# Patient Record
Sex: Female | Born: 1985 | Hispanic: No | Marital: Single | Smoking: Former smoker
Health system: Southern US, Community
[De-identification: ages and names within clinical notes are randomized; demographics above are authoritative.]

## PROBLEM LIST (undated history)

## (undated) ENCOUNTER — Inpatient Hospital Stay (HOSPITAL_COMMUNITY): Payer: Medicaid Other

## (undated) ENCOUNTER — Inpatient Hospital Stay (HOSPITAL_COMMUNITY): Payer: Self-pay

## (undated) DIAGNOSIS — E119 Type 2 diabetes mellitus without complications: Secondary | ICD-10-CM

## (undated) DIAGNOSIS — Z789 Other specified health status: Secondary | ICD-10-CM

## (undated) DIAGNOSIS — F32A Depression, unspecified: Secondary | ICD-10-CM

## (undated) DIAGNOSIS — I10 Essential (primary) hypertension: Secondary | ICD-10-CM

## (undated) HISTORY — PX: TONSILLECTOMY: SUR1361

## (undated) HISTORY — PX: BREAST SURGERY: SHX581

## (undated) HISTORY — PX: THERAPEUTIC ABORTION: SHX798

## (undated) HISTORY — DX: Depression, unspecified: F32.A

---
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
## 2007-01-08 ENCOUNTER — Inpatient Hospital Stay (HOSPITAL_COMMUNITY): Admission: AD | Admit: 2007-01-08 | Discharge: 2007-01-08 | Payer: Self-pay | Admitting: Obstetrics and Gynecology

## 2007-01-08 ENCOUNTER — Ambulatory Visit: Payer: Self-pay | Admitting: Obstetrics and Gynecology

## 2007-02-23 ENCOUNTER — Emergency Department (HOSPITAL_COMMUNITY): Admission: EM | Admit: 2007-02-23 | Discharge: 2007-02-23 | Payer: Self-pay | Admitting: Emergency Medicine

## 2007-04-18 ENCOUNTER — Ambulatory Visit: Payer: Self-pay | Admitting: Obstetrics and Gynecology

## 2007-04-18 ENCOUNTER — Inpatient Hospital Stay (HOSPITAL_COMMUNITY): Admission: AD | Admit: 2007-04-18 | Discharge: 2007-04-21 | Payer: Self-pay | Admitting: Obstetrics & Gynecology

## 2007-08-16 ENCOUNTER — Ambulatory Visit: Payer: Self-pay | Admitting: Obstetrics & Gynecology

## 2007-08-16 ENCOUNTER — Ambulatory Visit (HOSPITAL_COMMUNITY): Admission: AD | Admit: 2007-08-16 | Discharge: 2007-08-16 | Payer: Self-pay | Admitting: Gynecology

## 2007-08-16 ENCOUNTER — Encounter: Payer: Self-pay | Admitting: Obstetrics & Gynecology

## 2007-11-07 ENCOUNTER — Emergency Department (HOSPITAL_COMMUNITY): Admission: EM | Admit: 2007-11-07 | Discharge: 2007-11-07 | Payer: Self-pay | Admitting: Emergency Medicine

## 2007-12-10 ENCOUNTER — Emergency Department (HOSPITAL_COMMUNITY): Admission: EM | Admit: 2007-12-10 | Discharge: 2007-12-10 | Payer: Self-pay | Admitting: Emergency Medicine

## 2008-01-05 ENCOUNTER — Emergency Department (HOSPITAL_COMMUNITY): Admission: EM | Admit: 2008-01-05 | Discharge: 2008-01-05 | Payer: Self-pay | Admitting: Emergency Medicine

## 2008-04-27 ENCOUNTER — Emergency Department (HOSPITAL_COMMUNITY): Admission: EM | Admit: 2008-04-27 | Discharge: 2008-04-27 | Payer: Self-pay | Admitting: Emergency Medicine

## 2008-07-04 ENCOUNTER — Inpatient Hospital Stay (HOSPITAL_COMMUNITY): Admission: AD | Admit: 2008-07-04 | Discharge: 2008-07-04 | Payer: Self-pay | Admitting: Obstetrics & Gynecology

## 2009-04-30 ENCOUNTER — Emergency Department (HOSPITAL_COMMUNITY): Admission: EM | Admit: 2009-04-30 | Discharge: 2009-04-30 | Payer: Self-pay | Admitting: Emergency Medicine

## 2009-07-06 IMAGING — US US OB COMP LESS 14 WK
1 series · 14 of 25 positions shown · non-contrast
Comparison: none

CLINICAL DATA: 8 weeks pregnant and fainted.
 OBSTETRICAL ULTRASOUND <14 WKS:
TECHNIQUE: Transabdominal ultrasound was performed for evaluation of the gestation as well as the maternal uterus and adnexal regions.

[Series 1: us ob comp less 14 wks · 14 of 25 slices shown]
[im 1/25]
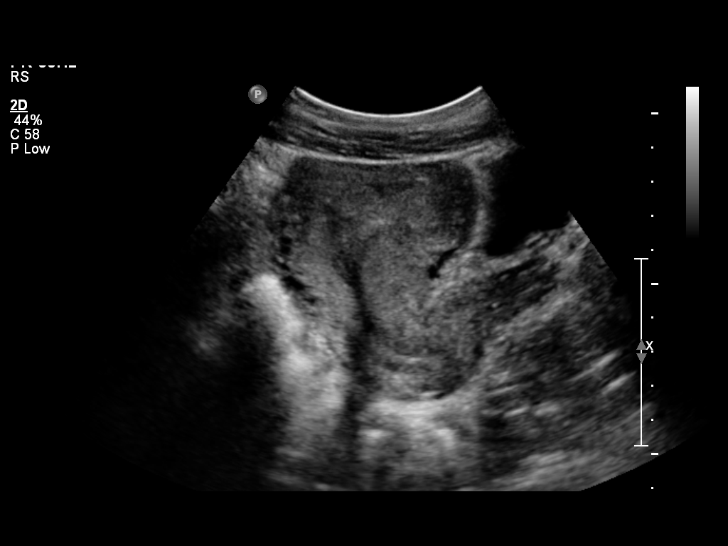
[im 3/25]
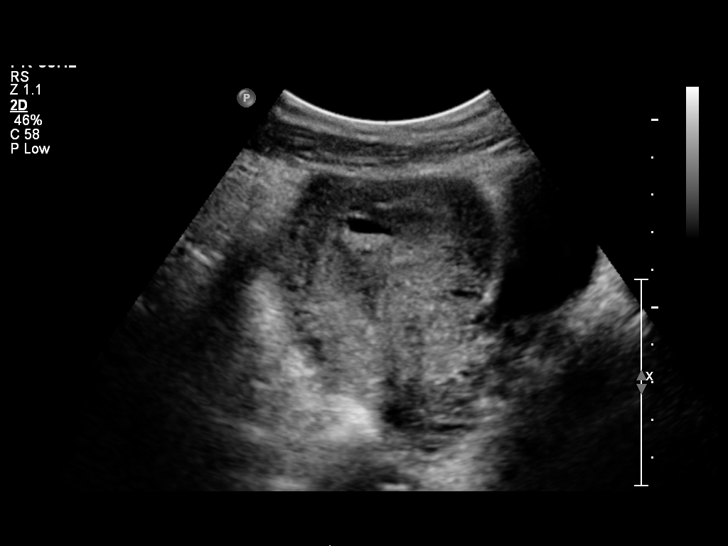
[im 5/25]
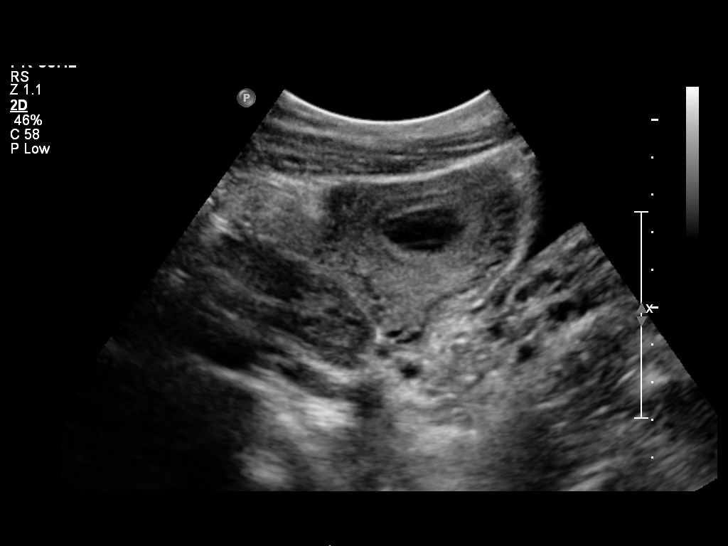
[im 7/25]
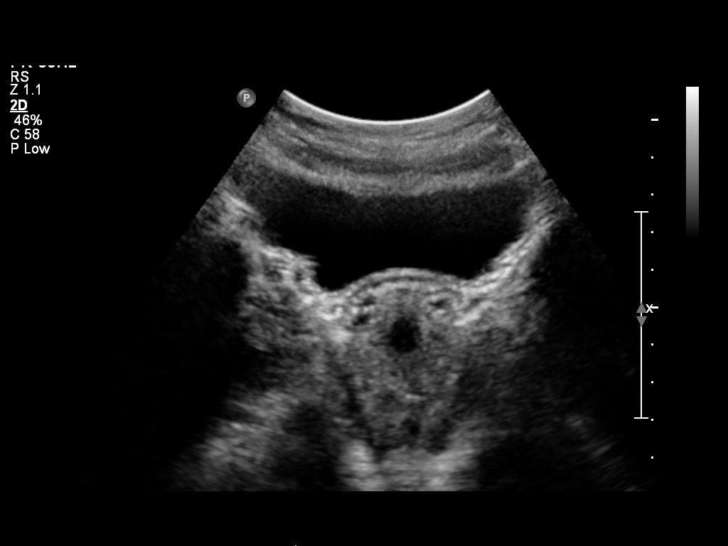
[im 9/25]
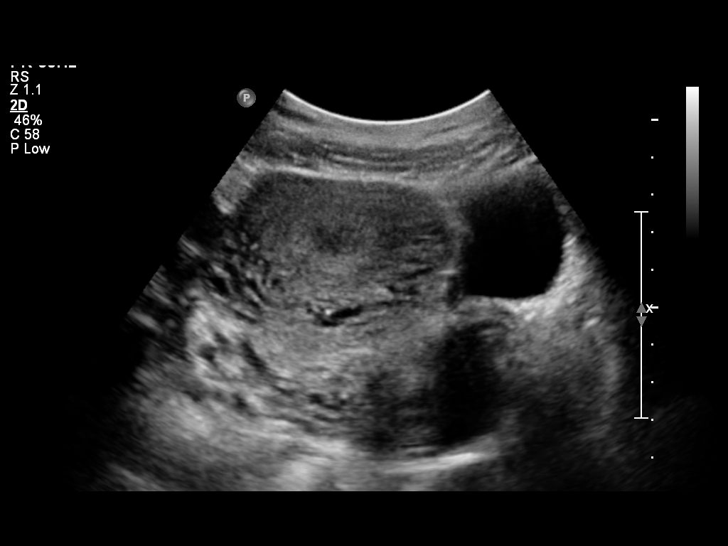
[im 10/25]
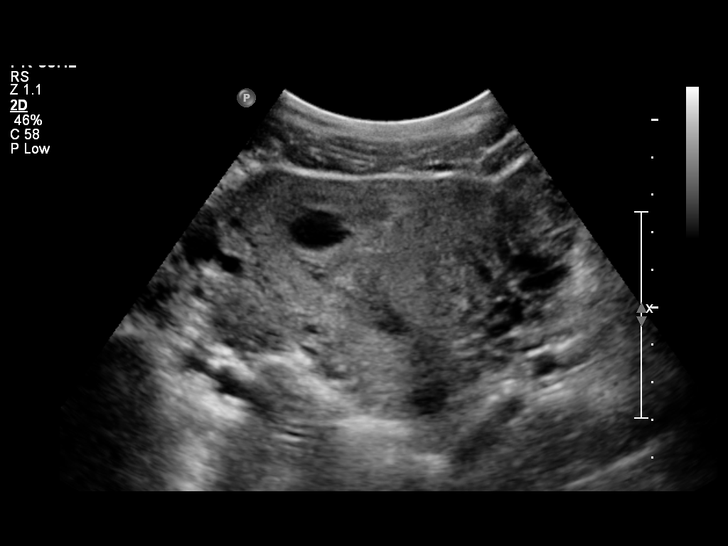
[im 12/25]
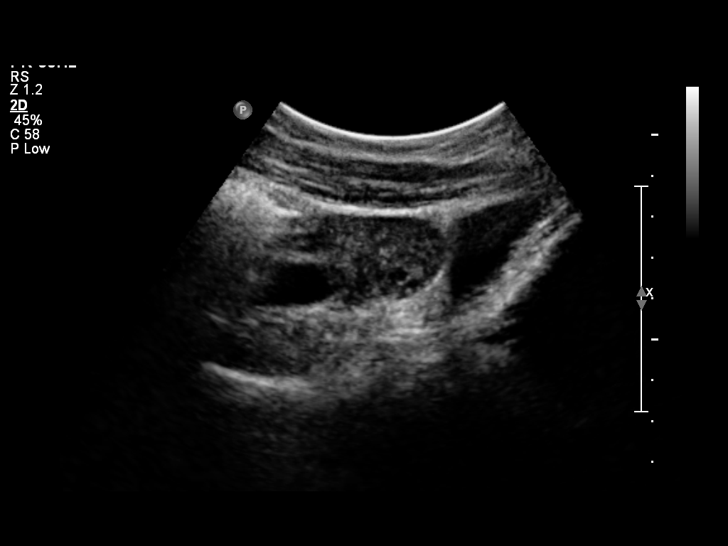
[im 14/25]
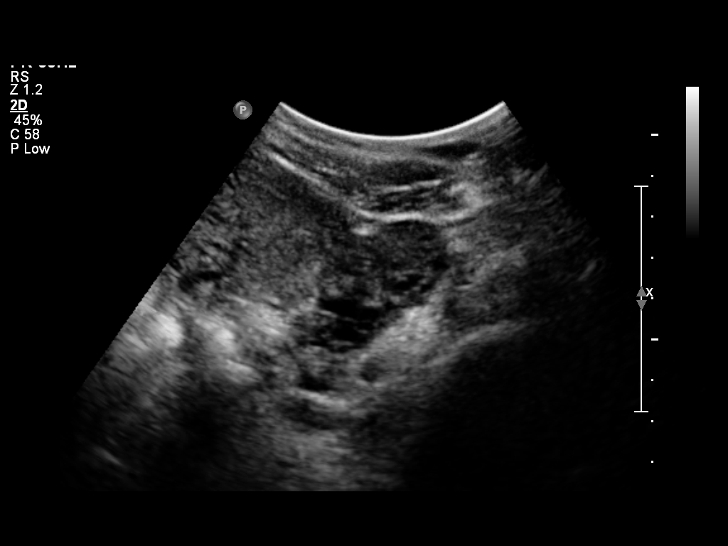
[im 16/25]
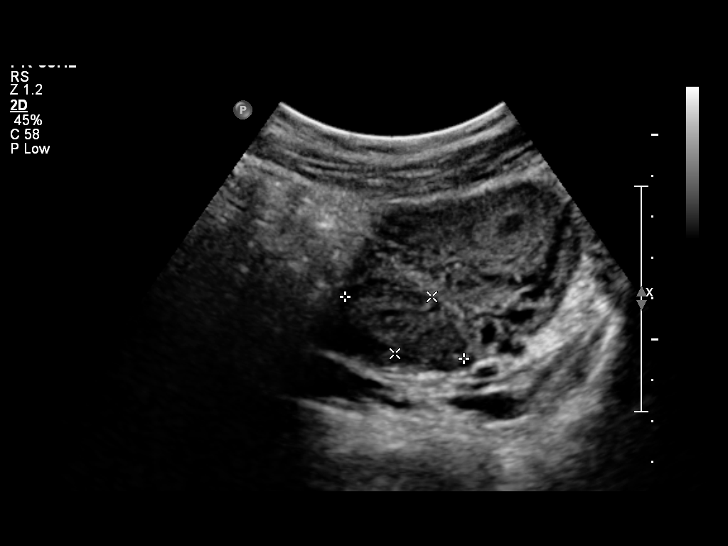
[im 17/25]
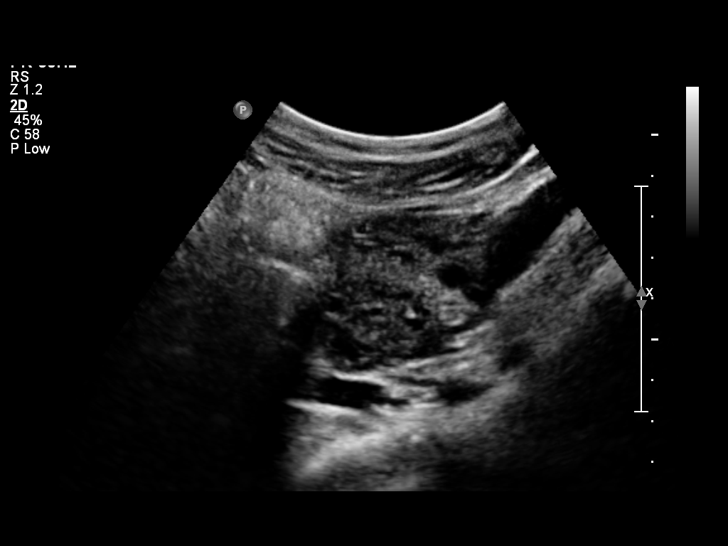
[im 19/25]
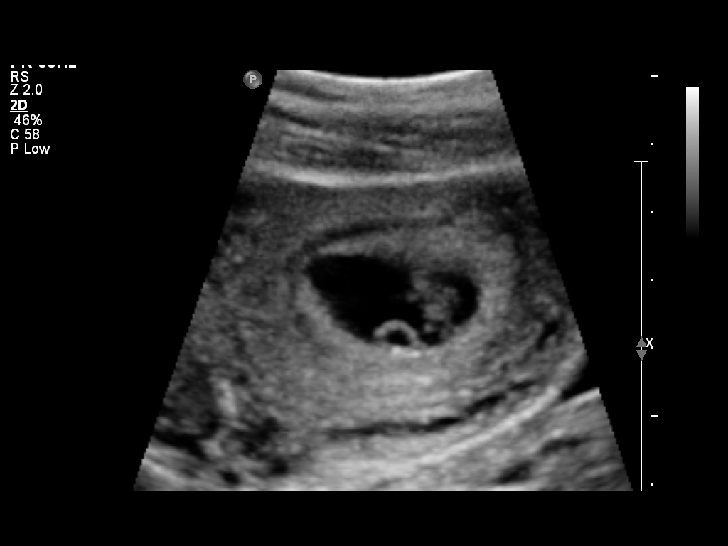
[im 21/25]
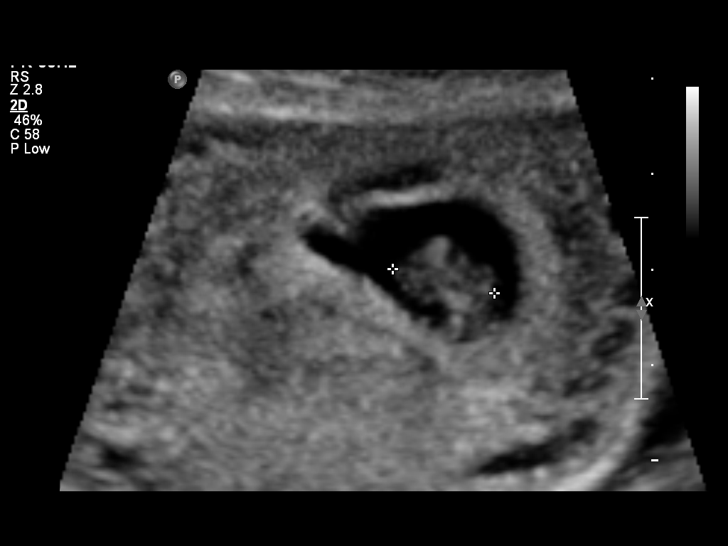
[im 23/25]
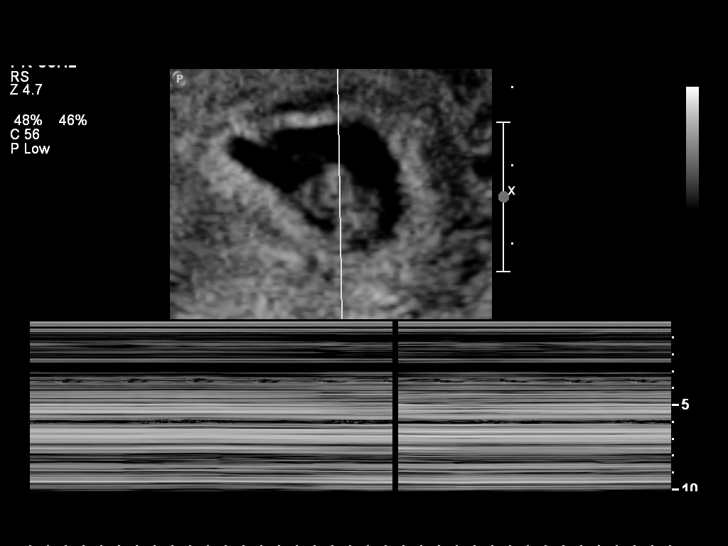
[im 25/25]
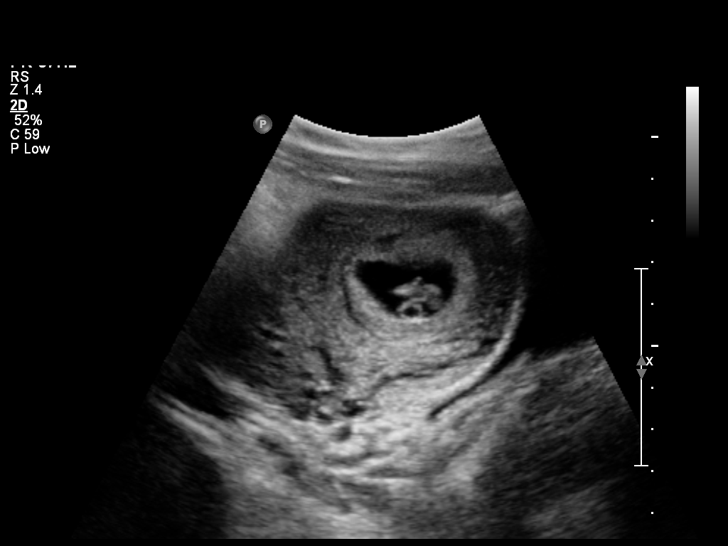

[14 of 25 positions shown; findings below may reference images not displayed]

FINDINGS: There is an intrauterine gestational sac.  Evidence for a fetus measuring 1.12 cm and a calculated age of 7 weeks 2 days.  There is also evidence for a small yolk sac.  Calculated heart rate is 161 bpm.  The ovarian tissue is grossly normal.  The right ovary measures 3.3 x 1.7 x 1.2 cm.  Left ovary measures 1.9 x 3.1 x 1.6 cm.  No significant free fluid.
IMPRESSION: Live single intrauterine fetus.  Calculated gestational age is 7 weeks 2 days and heart rate was 161 bpm.

## 2010-05-25 LAB — WET PREP, GENITAL: Clue Cells Wet Prep HPF POC: NONE SEEN

## 2010-05-25 LAB — URINALYSIS, ROUTINE W REFLEX MICROSCOPIC
Glucose, UA: NEGATIVE mg/dL
Ketones, ur: NEGATIVE mg/dL
Protein, ur: NEGATIVE mg/dL
Specific Gravity, Urine: <1.005 — ABNORMAL LOW (ref 1.005–1.030)

## 2010-05-25 LAB — GC/CHLAMYDIA PROBE AMP, GENITAL: GC Probe Amp, Genital: NEGATIVE

## 2010-06-29 NOTE — Op Note (Signed)
NAMECAROL, Deborah Brock              ACCOUNT NO.:  [id number]   MEDICAL RECORD NO.:  [id number]          PATIENT TYPE:  AMB   LOCATION:  SDC                           FACILITY:  [facility]   PHYSICIAN:  Allie Bossier, MD        DATE OF BIRTH:  [birth year]   DATE OF PROCEDURE:  DATE OF DISCHARGE:                               OPERATIVE REPORT   PREOPERATIVE DIAGNOSIS:  Missed abortion at 17 weeks' estimated  gestational age.   POSTOPERATIVE DIAGNOSIS:  Missed abortion at 8 weeks' estimated  gestational age.   PROCEDURE:  Suction dilation and curettage.   SURGEON:  Myra C. Marice Potter, MD   ANESTHESIA:  MAC, Cristela Blue, MD plus lidocaine 1%, paracervical  block.   COMPLICATIONS:  None.   ESTIMATED BLOOD LOSS:  Minimal.   SPECIMENS:  Uterine curettings.   DETAILS OF PROCEDURE AND FINDINGS:  The risks, benefits, and  alternatives of surgery were explained, she accepted.  She declined  Cytotec.  She was taken to the operating room after consents were  signed.  She was placed in the dorsal lithotomy position.  Her vagina  was prepped and draped in the usual sterile fashion.  Her bladder was  emptied with a Robinson catheter.  Bimanual exam revealed a 6-week size  anteverted mobile uterus and nonenlarged adnexa.  A speculum was placed.  The anterior lip of her paracervix was grasped with a single-tooth  tenaculum.  The cervix was easily and gently dilated with Shawnie Pons dilators  to accommodate a #8 curved suction curette.  Suction was applied.  The  uterus was emptied of its contents.  Sharp curettage in all quadrants  and the fundus of the uterus confirmed complete emptying of the uterus.  The tenaculum was removed.  No bleeding was noted from the site.  Please  note that during the case, a total of 10 mL of 1% lidocaine plain was  used for a paracervical block.  She tolerated the procedure well.  In  the postop, repeat bimanual exam revealed normal size, shape, and  anteverted uterus.  She was  taken to the recovery room in stable  condition.  Instrument, sponge, and needle counts were correct.      Allie Bossier, MD  Electronically Signed     MCD/MEDQ  D:  08/16/2007  T:  08/17/2007  Job:  [id number]

## 2010-07-06 ENCOUNTER — Emergency Department (HOSPITAL_COMMUNITY)
Admission: EM | Admit: 2010-07-06 | Discharge: 2010-07-06 | Disposition: A | Discharge status: Home / Self Care | Payer: Self-pay | Attending: Emergency Medicine | Admitting: Emergency Medicine

## 2010-07-06 DIAGNOSIS — N898 Other specified noninflammatory disorders of vagina: Secondary | ICD-10-CM | POA: Insufficient documentation

## 2010-07-06 LAB — POCT PREGNANCY, URINE: Preg Test, Ur: NEGATIVE

## 2010-07-06 LAB — URINALYSIS, ROUTINE W REFLEX MICROSCOPIC
Hgb urine dipstick: NEGATIVE
Nitrite: NEGATIVE
Specific Gravity, Urine: 1.013 (ref 1.005–1.030)
Urobilinogen, UA: 1 mg/dL (ref 0.0–1.0)

## 2010-07-06 LAB — WET PREP, GENITAL: Yeast Wet Prep HPF POC: NONE SEEN

## 2010-07-07 LAB — GC/CHLAMYDIA PROBE AMP, GENITAL
Chlamydia, DNA Probe: NEGATIVE
GC Probe Amp, Genital: NEGATIVE

## 2010-11-08 LAB — URINALYSIS, ROUTINE W REFLEX MICROSCOPIC
Ketones, ur: NEGATIVE
Leukocytes, UA: NEGATIVE
Protein, ur: NEGATIVE
Urobilinogen, UA: 0.2

## 2010-11-08 LAB — CBC
Hemoglobin: 11.3 — ABNORMAL LOW
Hemoglobin: 9.5 — ABNORMAL LOW
MCV: 81.9
Platelets: 263
RBC: 3.43 — ABNORMAL LOW
RBC: 4.02

## 2010-11-08 LAB — URINE MICROSCOPIC-ADD ON: WBC, UA: NONE SEEN

## 2010-11-08 LAB — RPR: RPR Ser Ql: NONREACTIVE

## 2010-11-11 LAB — CBC
HCT: 35.7 — ABNORMAL LOW
Hemoglobin: 12.1
MCV: 83.1
Platelets: 255
RDW: 13.2

## 2010-11-11 LAB — GC/CHLAMYDIA PROBE AMP, GENITAL: GC Probe Amp, Genital: NEGATIVE

## 2010-11-11 LAB — POCT PREGNANCY, URINE
Operator id: 22333
Preg Test, Ur: POSITIVE

## 2010-11-11 LAB — URINALYSIS, ROUTINE W REFLEX MICROSCOPIC
Glucose, UA: NEGATIVE
Leukocytes, UA: NEGATIVE
Protein, ur: 30 — AB
Specific Gravity, Urine: >1.03 — ABNORMAL HIGH
pH: 6

## 2010-11-11 LAB — WET PREP, GENITAL: Yeast Wet Prep HPF POC: NONE SEEN

## 2010-11-15 LAB — CBC
Hemoglobin: 13.7
MCHC: 33.5
Platelets: 306
RDW: 13.1

## 2010-11-15 LAB — URINALYSIS, ROUTINE W REFLEX MICROSCOPIC
Bilirubin Urine: NEGATIVE
Glucose, UA: NEGATIVE
Nitrite: NEGATIVE
Protein, ur: NEGATIVE
Specific Gravity, Urine: 1.011
Specific Gravity, Urine: 1.02
Urobilinogen, UA: 0.2
Urobilinogen, UA: 1

## 2010-11-15 LAB — POCT PREGNANCY, URINE
Preg Test, Ur: NEGATIVE
Preg Test, Ur: NEGATIVE

## 2010-11-15 LAB — URINE MICROSCOPIC-ADD ON

## 2010-11-15 LAB — WET PREP, GENITAL
Clue Cells Wet Prep HPF POC: NONE SEEN
Trich, Wet Prep: NONE SEEN
WBC, Wet Prep HPF POC: NONE SEEN

## 2010-11-15 LAB — GC/CHLAMYDIA PROBE AMP, GENITAL: GC Probe Amp, Genital: NEGATIVE

## 2010-11-23 LAB — URINALYSIS, ROUTINE W REFLEX MICROSCOPIC
Hgb urine dipstick: NEGATIVE
Nitrite: NEGATIVE
Protein, ur: NEGATIVE
Specific Gravity, Urine: 1.015
Urobilinogen, UA: 0.2

## 2010-11-29 LAB — URINALYSIS, ROUTINE W REFLEX MICROSCOPIC
Bilirubin Urine: NEGATIVE
Glucose, UA: NEGATIVE
Hgb urine dipstick: NEGATIVE
Ketones, ur: 15 — AB
Ketones, ur: NEGATIVE
Protein, ur: NEGATIVE
Specific Gravity, Urine: 1.025
Urobilinogen, UA: 1
pH: 6

## 2010-11-29 LAB — CBC
HCT: 33.2 — ABNORMAL LOW
Hemoglobin: 11.4 — ABNORMAL LOW
MCHC: 34.4
MCV: 83.1
RBC: 3.99

## 2010-11-29 LAB — COMPREHENSIVE METABOLIC PANEL
ALT: 10
BUN: 8
CO2: 25
Calcium: 8.8
Creatinine, Ser: 0.9
GFR calc non Af Amer: >60
Glucose, Bld: 110 — ABNORMAL HIGH

## 2010-11-29 LAB — GC/CHLAMYDIA PROBE AMP, GENITAL: GC Probe Amp, Genital: NEGATIVE

## 2010-11-29 LAB — WET PREP, GENITAL
Clue Cells Wet Prep HPF POC: NONE SEEN
Clue Cells Wet Prep HPF POC: NONE SEEN
Trich, Wet Prep: NONE SEEN
Yeast Wet Prep HPF POC: NONE SEEN
Yeast Wet Prep HPF POC: NONE SEEN

## 2010-11-29 LAB — URINE MICROSCOPIC-ADD ON

## 2010-12-24 ENCOUNTER — Encounter (HOSPITAL_COMMUNITY): Payer: Self-pay

## 2010-12-24 ENCOUNTER — Inpatient Hospital Stay (HOSPITAL_COMMUNITY)
Admission: AD | Admit: 2010-12-24 | Discharge: 2010-12-24 | Disposition: A | Discharge status: Home / Self Care | Payer: Medicaid Other | Source: Ambulatory Visit | Attending: Obstetrics & Gynecology | Admitting: Obstetrics & Gynecology

## 2010-12-24 DIAGNOSIS — R112 Nausea with vomiting, unspecified: Secondary | ICD-10-CM | POA: Insufficient documentation

## 2010-12-24 DIAGNOSIS — Z711 Person with feared health complaint in whom no diagnosis is made: Secondary | ICD-10-CM

## 2010-12-24 DIAGNOSIS — Z3202 Encounter for pregnancy test, result negative: Secondary | ICD-10-CM | POA: Insufficient documentation

## 2010-12-24 HISTORY — DX: Other specified health status: Z78.9

## 2010-12-24 LAB — URINALYSIS, ROUTINE W REFLEX MICROSCOPIC
Glucose, UA: NEGATIVE mg/dL
Hgb urine dipstick: NEGATIVE
Protein, ur: NEGATIVE mg/dL

## 2010-12-24 NOTE — ED Provider Notes (Signed)
History     No chief complaint on file.  HPIKimberly 62 y.o.presents with wanted confirmation of pregnancy.  G5 P2 0 2 2.  Sexually active X 1 using condoms occasionally.  Does not desire pregnancy. She did 2 pregnancy tests at home that were "very light".  She has not missed a period, came 1 month ago and she expects it any day.   Reports nausea and vomiting.  Denies vaginal bleeding or discharge.  Patient of [facility]. Has appt at her doctor for IUD insertion end of the month.       Past Medical History  Diagnosis Date  . No pertinent past medical history     Past Surgical History  Procedure Date  . Tonsillectomy   . Breast surgery     No family history on file.  History  Substance Use Topics  . Smoking status: Not on file  . Smokeless tobacco: Not on file  . Alcohol Use:     Allergies: No Known Allergies  Prescriptions prior to admission  Medication Sig Dispense Refill  . albuterol (PROVENTIL HFA;VENTOLIN HFA) 108 (90 BASE) MCG/ACT inhaler Inhale 2 puffs into the lungs daily as needed. For asthma       . ibuprofen (ADVIL,MOTRIN) 200 MG tablet Take 400 mg by mouth daily as needed. For pain         Review of Systems  Constitutional: Negative.   HENT: Negative.   Respiratory: Negative.   Gastrointestinal: Positive for nausea. Negative for vomiting and abdominal pain.  Genitourinary:       Negative for vaginal bleeding or discharge   Physical Exam   Blood pressure 112/73, pulse 98, temperature 98.8 F (37.1 C), temperature source Oral, resp. rate 18, height 5' 1.5" (1.562 m), weight 117 lb (53.071 kg), last menstrual period 11/15/2010.  Physical Exam   I began to instruct her on her examination and she stated she had a full exam 1 month ago and her cultures and lab work there were negative.  I offered again and she declined.    MAU Course  Procedures  MDM   Assessment and Plan  A:  Feared pregnancy  Negative pregnancy test   P: Use condoms until  your IUD is inserted.  Repeat pregnancy test if she missed her period.   KEY,EVE M 12/24/2010, 3:02 PM   Matt Holmes, NP 12/24/10 1516

## 2010-12-24 NOTE — ED Provider Notes (Signed)
Attestation of Attending Supervision of Advanced Practitioner: Evaluation and management procedures were performed by the PA/NP/CNM/OB Fellow under my supervision/collaboration. Chart reviewed, and agree with management and plan.  Rasul Decola, M.D. 12/24/2010 3:46 PM   

## 2010-12-24 NOTE — Progress Notes (Signed)
Pt states wanted to know if she was pregnant b/c she had 2 positive home pregnancy tests. Has experience intermittent nausea but denise vaginal bleeding/discharge/abdominal pain. States if she's not pregnant wants birth control. LMP 11/15/10.

## 2013-12-16 ENCOUNTER — Encounter (HOSPITAL_COMMUNITY): Payer: Self-pay

## 2015-02-15 NOTE — L&D Delivery Note (Signed)
Delivery Note Pt reached complete dilation and pushed great.  At 11:51 AM a healthy female was delivered via NSVD (Presentation: OA ).  APGAR: 8, 9; weight  pending.   Placenta status: delivered spontaneously .  Cord:  with the following complications: none .    Anesthesia: epidural Episiotomy: none   Lacerations: right abrasion   Suture Repair: n/a Est. Blood Loss (mL):  125ml  Mom to postpartum.  Baby to Couplet care / Skin to Skin. D/Brock parents circumcision and they plan to perform in office. Deborah Brock,Deborah Brock 10/25/2015, 12:10 PM

## 2015-03-10 ENCOUNTER — Encounter: Payer: Self-pay | Admitting: *Deleted

## 2015-03-10 DIAGNOSIS — Z3490 Encounter for supervision of normal pregnancy, unspecified, unspecified trimester: Secondary | ICD-10-CM | POA: Insufficient documentation

## 2015-03-10 DIAGNOSIS — E559 Vitamin D deficiency, unspecified: Secondary | ICD-10-CM | POA: Insufficient documentation

## 2015-03-25 ENCOUNTER — Encounter: Payer: Self-pay | Admitting: Student

## 2015-03-25 ENCOUNTER — Encounter: Payer: Medicaid Other | Admitting: Student

## 2015-07-21 ENCOUNTER — Inpatient Hospital Stay (HOSPITAL_COMMUNITY)
Admission: AD | Admit: 2015-07-21 | Payer: Medicaid Other | Source: Ambulatory Visit | Admitting: Obstetrics and Gynecology

## 2015-08-07 DIAGNOSIS — O98519 Other viral diseases complicating pregnancy, unspecified trimester: Secondary | ICD-10-CM | POA: Insufficient documentation

## 2015-08-07 DIAGNOSIS — B009 Herpesviral infection, unspecified: Secondary | ICD-10-CM | POA: Insufficient documentation

## 2015-09-23 ENCOUNTER — Inpatient Hospital Stay (HOSPITAL_COMMUNITY)
Admission: AD | Admit: 2015-09-23 | Discharge: 2015-09-23 | Disposition: A | Discharge status: Home / Self Care | Payer: Medicaid Other | Source: Ambulatory Visit | Attending: Obstetrics and Gynecology | Admitting: Obstetrics and Gynecology

## 2015-09-23 DIAGNOSIS — Z3A00 Weeks of gestation of pregnancy not specified: Secondary | ICD-10-CM | POA: Diagnosis not present

## 2015-09-23 MED ORDER — BETAMETHASONE SOD PHOS & ACET 6 (3-3) MG/ML IJ SUSP
12.0000 mg | Freq: Once | INTRAMUSCULAR | Status: AC
  Administered 2015-09-23: 12 mg via INTRAMUSCULAR
  Filled 2015-09-23: qty 2

## 2015-09-23 NOTE — MAU Note (Signed)
Patient here to receive first injection of betamethasone

## 2015-09-24 ENCOUNTER — Inpatient Hospital Stay (HOSPITAL_COMMUNITY)
Admission: AD | Admit: 2015-09-24 | Discharge: 2015-09-24 | Disposition: A | Discharge status: Home / Self Care | Payer: Medicaid Other | Source: Ambulatory Visit | Attending: Obstetrics & Gynecology | Admitting: Obstetrics & Gynecology

## 2015-09-24 DIAGNOSIS — O26899 Other specified pregnancy related conditions, unspecified trimester: Secondary | ICD-10-CM | POA: Insufficient documentation

## 2015-09-24 MED ORDER — BETAMETHASONE SOD PHOS & ACET 6 (3-3) MG/ML IJ SUSP
12.0000 mg | Freq: Once | INTRAMUSCULAR | Status: AC
  Administered 2015-09-24: 12 mg via INTRAMUSCULAR
  Filled 2015-09-24: qty 2

## 2015-10-22 NOTE — MAU Provider Note (Signed)
Pt seen in the office with preterm cervical dilation that had progressed since last visit.  No contractions or leakage of fluid and good FM.  Pt sent to hospital to get betamethasone for preterm delivery risk.

## 2015-10-24 ENCOUNTER — Encounter (HOSPITAL_COMMUNITY): Payer: Self-pay | Admitting: *Deleted

## 2015-10-24 ENCOUNTER — Inpatient Hospital Stay (HOSPITAL_COMMUNITY)
Admission: AD | Admit: 2015-10-24 | Discharge: 2015-10-27 | DRG: 774 | Disposition: A | Discharge status: Home / Self Care | Payer: Medicaid Other | Source: Ambulatory Visit | Attending: Obstetrics and Gynecology | Admitting: Obstetrics and Gynecology

## 2015-10-24 DIAGNOSIS — Z3A37 37 weeks gestation of pregnancy: Secondary | ICD-10-CM

## 2015-10-24 DIAGNOSIS — Z8249 Family history of ischemic heart disease and other diseases of the circulatory system: Secondary | ICD-10-CM

## 2015-10-24 DIAGNOSIS — O4292 Full-term premature rupture of membranes, unspecified as to length of time between rupture and onset of labor: Principal | ICD-10-CM | POA: Diagnosis present

## 2015-10-24 DIAGNOSIS — A6000 Herpesviral infection of urogenital system, unspecified: Secondary | ICD-10-CM | POA: Diagnosis present

## 2015-10-24 DIAGNOSIS — O9962 Diseases of the digestive system complicating childbirth: Secondary | ICD-10-CM | POA: Diagnosis present

## 2015-10-24 DIAGNOSIS — O9832 Other infections with a predominantly sexual mode of transmission complicating childbirth: Secondary | ICD-10-CM | POA: Diagnosis present

## 2015-10-24 DIAGNOSIS — O99334 Smoking (tobacco) complicating childbirth: Secondary | ICD-10-CM | POA: Diagnosis present

## 2015-10-24 DIAGNOSIS — Z833 Family history of diabetes mellitus: Secondary | ICD-10-CM

## 2015-10-24 DIAGNOSIS — K219 Gastro-esophageal reflux disease without esophagitis: Secondary | ICD-10-CM | POA: Diagnosis present

## 2015-10-24 LAB — AMNISURE RUPTURE OF MEMBRANE (ROM) NOT AT ARMC: Amnisure ROM: POSITIVE

## 2015-10-24 LAB — POCT FERN TEST: POCT FERN TEST: NEGATIVE

## 2015-10-24 NOTE — MAU Note (Signed)
Pt reports ? Leaking fluid since 0700, reports a few contractions. Last SVE 4/80.

## 2015-10-25 ENCOUNTER — Inpatient Hospital Stay (HOSPITAL_COMMUNITY): Payer: Medicaid Other | Admitting: Anesthesiology

## 2015-10-25 ENCOUNTER — Encounter (HOSPITAL_COMMUNITY): Payer: Self-pay | Admitting: *Deleted

## 2015-10-25 DIAGNOSIS — Z833 Family history of diabetes mellitus: Secondary | ICD-10-CM | POA: Diagnosis not present

## 2015-10-25 DIAGNOSIS — O99334 Smoking (tobacco) complicating childbirth: Secondary | ICD-10-CM | POA: Diagnosis present

## 2015-10-25 DIAGNOSIS — Z3A37 37 weeks gestation of pregnancy: Secondary | ICD-10-CM | POA: Diagnosis not present

## 2015-10-25 DIAGNOSIS — Z3483 Encounter for supervision of other normal pregnancy, third trimester: Secondary | ICD-10-CM | POA: Diagnosis present

## 2015-10-25 DIAGNOSIS — K219 Gastro-esophageal reflux disease without esophagitis: Secondary | ICD-10-CM | POA: Diagnosis present

## 2015-10-25 DIAGNOSIS — Z8249 Family history of ischemic heart disease and other diseases of the circulatory system: Secondary | ICD-10-CM | POA: Diagnosis not present

## 2015-10-25 DIAGNOSIS — A6000 Herpesviral infection of urogenital system, unspecified: Secondary | ICD-10-CM | POA: Diagnosis present

## 2015-10-25 DIAGNOSIS — O4292 Full-term premature rupture of membranes, unspecified as to length of time between rupture and onset of labor: Secondary | ICD-10-CM | POA: Diagnosis present

## 2015-10-25 DIAGNOSIS — O9832 Other infections with a predominantly sexual mode of transmission complicating childbirth: Secondary | ICD-10-CM | POA: Diagnosis present

## 2015-10-25 DIAGNOSIS — O9962 Diseases of the digestive system complicating childbirth: Secondary | ICD-10-CM | POA: Diagnosis present

## 2015-10-25 LAB — CBC
HCT: 31.6 % — ABNORMAL LOW (ref 36.0–46.0)
HEMOGLOBIN: 10.7 g/dL — AB (ref 12.0–15.0)
MCH: 28.2 pg (ref 26.0–34.0)
MCHC: 33.9 g/dL (ref 30.0–36.0)
MCV: 83.2 fL (ref 78.0–100.0)
PLATELETS: 247 10*3/uL (ref 150–400)
RBC: 3.8 MIL/uL — AB (ref 3.87–5.11)
RDW: 14.6 % (ref 11.5–15.5)
WBC: 16.9 10*3/uL — AB (ref 4.0–10.5)

## 2015-10-25 LAB — RPR: RPR: NONREACTIVE

## 2015-10-25 LAB — TYPE AND SCREEN
ABO/RH(D): O POS
ANTIBODY SCREEN: NEGATIVE

## 2015-10-25 MED ORDER — PHENYLEPHRINE 40 MCG/ML (10ML) SYRINGE FOR IV PUSH (FOR BLOOD PRESSURE SUPPORT)
PREFILLED_SYRINGE | INTRAVENOUS | Status: AC
  Filled 2015-10-25: qty 20

## 2015-10-25 MED ORDER — OXYCODONE HCL 5 MG PO TABS
5.0000 mg | ORAL_TABLET | ORAL | Status: DC | PRN
  Administered 2015-10-25: 5 mg via ORAL
  Filled 2015-10-25: qty 1

## 2015-10-25 MED ORDER — LIDOCAINE HCL (PF) 1 % IJ SOLN
30.0000 mL | INTRAMUSCULAR | Status: DC | PRN
  Filled 2015-10-25: qty 30

## 2015-10-25 MED ORDER — FENTANYL 2.5 MCG/ML BUPIVACAINE 1/10 % EPIDURAL INFUSION (WH - ANES)
INTRAMUSCULAR | Status: AC
  Filled 2015-10-25: qty 125

## 2015-10-25 MED ORDER — EPHEDRINE 5 MG/ML INJ
10.0000 mg | INTRAVENOUS | Status: DC | PRN
  Filled 2015-10-25: qty 4

## 2015-10-25 MED ORDER — PRENATAL MULTIVITAMIN CH
1.0000 | ORAL_TABLET | Freq: Every day | ORAL | Status: DC
  Administered 2015-10-26 – 2015-10-27 (×2): 1 via ORAL
  Filled 2015-10-25 (×2): qty 1

## 2015-10-25 MED ORDER — OXYTOCIN 40 UNITS IN LACTATED RINGERS INFUSION - SIMPLE MED
1.0000 m[IU]/min | INTRAVENOUS | Status: DC
  Administered 2015-10-25: 2 m[IU]/min via INTRAVENOUS
  Filled 2015-10-25: qty 1000

## 2015-10-25 MED ORDER — ONDANSETRON HCL 4 MG PO TABS: 4.0000 mg | ORAL_TABLET | ORAL | Status: DC | PRN

## 2015-10-25 MED ORDER — DIPHENHYDRAMINE HCL 25 MG PO CAPS: 25.0000 mg | ORAL_CAPSULE | Freq: Four times a day (QID) | ORAL | Status: DC | PRN

## 2015-10-25 MED ORDER — PENICILLIN G POTASSIUM 5000000 UNITS IJ SOLR
5.0000 10*6.[IU] | Freq: Once | INTRAVENOUS | Status: AC
  Administered 2015-10-25: 5 10*6.[IU] via INTRAVENOUS
  Filled 2015-10-25: qty 5

## 2015-10-25 MED ORDER — TETANUS-DIPHTH-ACELL PERTUSSIS 5-2.5-18.5 LF-MCG/0.5 IM SUSP: 0.5000 mL | Freq: Once | INTRAMUSCULAR | Status: DC

## 2015-10-25 MED ORDER — LACTATED RINGERS IV SOLN
500.0000 mL | Freq: Once | INTRAVENOUS | Status: AC
  Administered 2015-10-25: 500 mL via INTRAVENOUS

## 2015-10-25 MED ORDER — LACTATED RINGERS IV SOLN
INTRAVENOUS | Status: DC
  Administered 2015-10-25 (×2): via INTRAVENOUS

## 2015-10-25 MED ORDER — ACETAMINOPHEN 325 MG PO TABS: 650.0000 mg | ORAL_TABLET | ORAL | Status: DC | PRN

## 2015-10-25 MED ORDER — OXYTOCIN 40 UNITS IN LACTATED RINGERS INFUSION - SIMPLE MED: 2.5000 [IU]/h | INTRAVENOUS | Status: DC

## 2015-10-25 MED ORDER — ONDANSETRON HCL 4 MG/2ML IJ SOLN: 4.0000 mg | INTRAMUSCULAR | Status: DC | PRN

## 2015-10-25 MED ORDER — ZOLPIDEM TARTRATE 5 MG PO TABS: 5.0000 mg | ORAL_TABLET | Freq: Every evening | ORAL | Status: DC | PRN

## 2015-10-25 MED ORDER — ACETAMINOPHEN 325 MG PO TABS
650.0000 mg | ORAL_TABLET | ORAL | Status: DC | PRN
  Administered 2015-10-25 – 2015-10-27 (×4): 650 mg via ORAL
  Filled 2015-10-25 (×4): qty 2

## 2015-10-25 MED ORDER — DIPHENHYDRAMINE HCL 50 MG/ML IJ SOLN: 12.5000 mg | INTRAMUSCULAR | Status: DC | PRN

## 2015-10-25 MED ORDER — TERBUTALINE SULFATE 1 MG/ML IJ SOLN
0.2500 mg | Freq: Once | INTRAMUSCULAR | Status: DC | PRN
  Filled 2015-10-25: qty 1

## 2015-10-25 MED ORDER — SIMETHICONE 80 MG PO CHEW: 80.0000 mg | CHEWABLE_TABLET | ORAL | Status: DC | PRN

## 2015-10-25 MED ORDER — PHENYLEPHRINE 40 MCG/ML (10ML) SYRINGE FOR IV PUSH (FOR BLOOD PRESSURE SUPPORT)
80.0000 ug | PREFILLED_SYRINGE | INTRAVENOUS | Status: DC | PRN
  Filled 2015-10-25: qty 5

## 2015-10-25 MED ORDER — BENZOCAINE-MENTHOL 20-0.5 % EX AERO
1.0000 "application " | INHALATION_SPRAY | CUTANEOUS | Status: DC | PRN
  Administered 2015-10-25: 1 via TOPICAL
  Filled 2015-10-25: qty 56

## 2015-10-25 MED ORDER — FLEET ENEMA 7-19 GM/118ML RE ENEM: 1.0000 | ENEMA | RECTAL | Status: DC | PRN

## 2015-10-25 MED ORDER — OXYCODONE-ACETAMINOPHEN 5-325 MG PO TABS: 1.0000 | ORAL_TABLET | ORAL | Status: DC | PRN

## 2015-10-25 MED ORDER — DIBUCAINE 1 % RE OINT: 1.0000 "application " | TOPICAL_OINTMENT | RECTAL | Status: DC | PRN

## 2015-10-25 MED ORDER — LIDOCAINE HCL (PF) 1 % IJ SOLN
INTRAMUSCULAR | Status: DC | PRN
  Administered 2015-10-25 (×2): 4 mL via EPIDURAL

## 2015-10-25 MED ORDER — WITCH HAZEL-GLYCERIN EX PADS: 1.0000 "application " | MEDICATED_PAD | CUTANEOUS | Status: DC | PRN

## 2015-10-25 MED ORDER — ONDANSETRON HCL 4 MG/2ML IJ SOLN
4.0000 mg | Freq: Four times a day (QID) | INTRAMUSCULAR | Status: DC | PRN
Start: 2015-10-25 — End: 2015-10-25
  Administered 2015-10-25 (×2): 4 mg via INTRAVENOUS
  Filled 2015-10-25 (×2): qty 2

## 2015-10-25 MED ORDER — LACTATED RINGERS IV SOLN: 500.0000 mL | INTRAVENOUS | Status: DC | PRN

## 2015-10-25 MED ORDER — ALBUTEROL SULFATE (2.5 MG/3ML) 0.083% IN NEBU: 2.5000 mg | INHALATION_SOLUTION | Freq: Four times a day (QID) | RESPIRATORY_TRACT | Status: DC | PRN

## 2015-10-25 MED ORDER — OXYTOCIN BOLUS FROM INFUSION
500.0000 mL | Freq: Once | INTRAVENOUS | Status: AC
  Administered 2015-10-25: 500 mL/h via INTRAVENOUS

## 2015-10-25 MED ORDER — SOD CITRATE-CITRIC ACID 500-334 MG/5ML PO SOLN
30.0000 mL | ORAL | Status: DC | PRN
  Administered 2015-10-25: 30 mL via ORAL
  Filled 2015-10-25: qty 15

## 2015-10-25 MED ORDER — FENTANYL CITRATE (PF) 100 MCG/2ML IJ SOLN
50.0000 ug | INTRAMUSCULAR | Status: DC | PRN
  Administered 2015-10-25: 50 ug via INTRAVENOUS
  Filled 2015-10-25: qty 2

## 2015-10-25 MED ORDER — SENNOSIDES-DOCUSATE SODIUM 8.6-50 MG PO TABS
2.0000 | ORAL_TABLET | ORAL | Status: DC
  Administered 2015-10-26 (×2): 2 via ORAL
  Filled 2015-10-25 (×2): qty 2

## 2015-10-25 MED ORDER — COCONUT OIL OIL: 1.0000 "application " | TOPICAL_OIL | Status: DC | PRN

## 2015-10-25 MED ORDER — PENICILLIN G POTASSIUM 5000000 UNITS IJ SOLR
2.5000 10*6.[IU] | INTRAMUSCULAR | Status: DC
  Administered 2015-10-25: 2.5 10*6.[IU] via INTRAVENOUS
  Filled 2015-10-25 (×6): qty 2.5

## 2015-10-25 MED ORDER — OXYCODONE-ACETAMINOPHEN 5-325 MG PO TABS: 2.0000 | ORAL_TABLET | ORAL | Status: DC | PRN

## 2015-10-25 MED ORDER — FENTANYL 2.5 MCG/ML BUPIVACAINE 1/10 % EPIDURAL INFUSION (WH - ANES)
14.0000 mL/h | INTRAMUSCULAR | Status: DC | PRN
  Administered 2015-10-25: 11.5 mL/h via EPIDURAL
  Administered 2015-10-25: 14 mL/h via EPIDURAL
  Filled 2015-10-25: qty 125

## 2015-10-25 MED ORDER — IBUPROFEN 600 MG PO TABS
600.0000 mg | ORAL_TABLET | Freq: Four times a day (QID) | ORAL | Status: DC
  Administered 2015-10-25 – 2015-10-27 (×8): 600 mg via ORAL
  Filled 2015-10-25 (×8): qty 1

## 2015-10-25 NOTE — Anesthesia Procedure Notes (Signed)
Epidural Patient location during procedure: OB Start time: 10/25/2015 2:47 AM  Staffing Anesthesiologist: Mal AmabileFOSTER, Makyla Bye Performed: anesthesiologist   Preanesthetic Checklist Completed: patient identified, site marked, surgical consent, pre-op evaluation, timeout performed, IV checked, risks and benefits discussed and monitors and equipment checked  Epidural Patient position: sitting Prep: site prepped and draped and DuraPrep Patient monitoring: continuous pulse ox and blood pressure Approach: midline Location: L3-L4 Injection technique: LOR air  Needle:  Needle type: Tuohy  Needle gauge: 17 G Needle length: 9 cm and 9 Needle insertion depth: 4 cm Catheter type: closed end flexible Catheter size: 19 Gauge Catheter at skin depth: 9 cm Test dose: negative and Other  Assessment Events: blood not aspirated, injection not painful, no injection resistance, negative IV test and no paresthesia  Additional Notes Patient identified. Risks and benefits discussed including failed block, incomplete  Pain control, post dural puncture headache, nerve damage, paralysis, blood pressure Changes, nausea, vomiting, reactions to medications-both toxic and allergic and post Partum back pain. All questions were answered. Patient expressed understanding and wished to proceed. Sterile technique was used throughout procedure. Epidural site was Dressed with sterile barrier dressing. No paresthesias, signs of intravascular injection Or signs of intrathecal spread were encountered.  Patient was more comfortable after the epidural was dosed. Please see RN's note for documentation of vital signs and FHR which are stable.

## 2015-10-25 NOTE — Anesthesia Postprocedure Evaluation (Signed)
Anesthesia Post Note  Patient: Deborah Brock  Procedure(s) Performed: * No procedures listed *  Patient location during evaluation: Mother Baby Anesthesia Type: Epidural Level of consciousness: awake and alert, oriented and patient cooperative Pain management: pain level controlled Vital Signs Assessment: post-procedure vital signs reviewed and stable Respiratory status: spontaneous breathing Cardiovascular status: stable Postop Assessment: no headache, epidural receding, patient able to bend at knees and no signs of nausea or vomiting Anesthetic complications: no Comments: Pain score 5.  Received pain medication immediately prior to interview.     Last Vitals:  Vitals:   10/25/15 1345 10/25/15 1505  BP: 114/68 113/62  Pulse: (!) 57 (!) 59  Resp: 18 20  Temp: 36.8 C 36.7 C    Last Pain:  Vitals:   10/25/15 1741  TempSrc:   PainSc: 6    Pain Goal: Patients Stated Pain Goal: 2 (10/25/15 0200)               Merrilyn PumaWRINKLE,Liann Spaeth

## 2015-10-25 NOTE — Anesthesia Pain Management Evaluation Note (Signed)
  CRNA Pain Management Visit Note  Patient: Deborah Brock, 30 y.o., female  "Hello [facility] am a [facility]. We have an anesthesia team available at all times to provide care throughout the hospital, including epidural management and anesthesia for C-section. I don't know your plan for the delivery whether it a natural birth, water birth, IV sedation, nitrous supplementation, doula or epidural, but we want to meet your pain goals."   1.Was your pain managed to your expectations on prior hospitalizations?   Yes   2.What is your expectation for pain management during this hospitalization?     Epidural  3.How can we help you reach that goal? Epidural   Record the patient's initial score and the patient's pain goal.   Pain: 0  Pain Goal: 3 [facility] wants you to be able to say your pain was always managed very well.  Lyle Leisner 10/25/2015

## 2015-10-25 NOTE — H&P (Signed)
Kern AlbertaKimberly Brock is a 30 y.o. female [id number] at 5437 0/7 weeks (EDD 11/15/15 by 8 week US) presenting for LOF over the last day off and on but just reported for evaluation last night around 11pm.  Was amniosure positive and just having mild contractions.  Prenatal care was significant for preterm dilation to 2cm at 32 weeks and was taken out of work and given betamethasone.   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    OB History    Gravida Para Term Preterm AB Living   7 2 2  0 4 2   SAB TAB Ectopic Multiple Live Births   2 2 0 0      NSVD 2004 7#13oz NSVD 2009 8#12oz  EAB x 3 SAB x 2  Past Medical History:  Diagnosis Date  . No pertinent past medical history    Past Surgical History:  Procedure Laterality Date  . BREAST SURGERY     Augmentation 10/2013  . THERAPEUTIC ABORTION     May 2016  . TONSILLECTOMY     Family History: family history includes Diabetes in her mother; Hypertension in her mother. Social History:  reports that she has been smoking.  She has never used smokeless tobacco. She reports that she does not drink alcohol or use drugs.     Maternal Diabetes: No  Genetic Screening: Normal Maternal Ultrasounds/Referrals: Normal Fetal Ultrasounds or other Referrals:  None Maternal Substance Abuse:  Yes:  Type: Smoker Significant Maternal Medications:  None Significant Maternal Lab Results:  None Other Comments:  None  ROS History Dilation: 4 Effacement (%): 60 Station: -2 Exam by:: Bertram MillardJ. Lopez, RN Blood pressure 119/73, pulse 71, temperature 98.3 F (36.8 C), temperature source Oral, resp. rate 18, height 5\' 1"  (1.549 m), weight 73 kg (161 lb), SpO2 100 %. Exam Physical Exam  Prenatal labs: ABO, Rh: --/--/O POS (09/10 0045) Antibody: NEG (09/10 0045) Rubella:  Immune RPR:   NR HBsAg:   Neg HIV:   NR GBS:   Neg One hour GCT 140 Three hour GTT with one elevated value CF negative Hgb AA First trimester screen  WNL  Assessment/Plan: Pt amniosure + for ROM and no significant labor, will augment with pitocin.  Oliver PilaICHARDSON,Deborah Brock 10/25/2015, 7:26 AM

## 2015-10-25 NOTE — Progress Notes (Signed)
Patient ID: Deborah Brock, female   DOB: 31-May-1985, 30 y.o.   MRN: [id number] Pt comfortable, has made slow progress overnight with pitocin  afeb vss Cervix 90/4-5/-1 AROM forebag and IUPC placed  Will adjust pitocin as needed, hopefully forebag was slowing down FHR category 1

## 2015-10-25 NOTE — Anesthesia Preprocedure Evaluation (Signed)
Anesthesia Evaluation  Patient identified by MRN, date of birth, ID band Patient awake    Reviewed: Allergy & Precautions, Patient's Chart, lab work & pertinent test results  Airway Mallampati: II  TM Distance: >3 FB Neck ROM: Full    Dental no notable dental hx.    Pulmonary Current Smoker,    Pulmonary exam normal breath sounds clear to auscultation       Cardiovascular negative cardio ROS Normal cardiovascular exam Rhythm:Regular     Neuro/Psych negative neurological ROS  negative psych ROS   GI/Hepatic Neg liver ROS, GERD  ,  Endo/Other  Obesity  Renal/GU negative Renal ROS  negative genitourinary   Musculoskeletal negative musculoskeletal ROS (+)   Abdominal (+) + obese,   Peds  Hematology negative hematology ROS (+)   Anesthesia Other Findings   Reproductive/Obstetrics (+) Pregnancy                             Lab Results  Component Value Date   WBC 16.9 (H) 10/25/2015   HGB 10.7 (L) 10/25/2015   HCT 31.6 (L) 10/25/2015   MCV 83.2 10/25/2015   PLT 247 10/25/2015    Anesthesia Physical Anesthesia Plan  ASA: II  Anesthesia Plan: Epidural   Post-op Pain Management:    Induction:   Airway Management Planned: Natural Airway  Additional Equipment:   Intra-op Plan:   Post-operative Plan:   Informed Consent: I have reviewed the patients History and Physical, chart, labs and discussed the procedure including the risks, benefits and alternatives for the proposed anesthesia with the patient or authorized representative who has indicated his/her understanding and acceptance.     Plan Discussed with: Anesthesiologist  Anesthesia Plan Comments:         Anesthesia Quick Evaluation

## 2015-10-26 LAB — CBC
HCT: 28.4 % — ABNORMAL LOW (ref 36.0–46.0)
HEMOGLOBIN: 9.6 g/dL — AB (ref 12.0–15.0)
MCH: 27.7 pg (ref 26.0–34.0)
MCHC: 33.8 g/dL (ref 30.0–36.0)
MCV: 81.8 fL (ref 78.0–100.0)
Platelets: 265 10*3/uL (ref 150–400)
RBC: 3.47 MIL/uL — AB (ref 3.87–5.11)
RDW: 14.4 % (ref 11.5–15.5)
WBC: 17.4 10*3/uL — ABNORMAL HIGH (ref 4.0–10.5)

## 2015-10-26 MED ORDER — IBUPROFEN 600 MG PO TABS: 600.0000 mg | ORAL_TABLET | Freq: Four times a day (QID) | ORAL | 0 refills | Status: DC

## 2015-10-26 NOTE — Discharge Summary (Signed)
OB Discharge Summary     Patient Name: Deborah Brock DOB: 22-Jul-1985 MRN: [id number]  Date of admission: 10/24/2015 Delivering MD: Huel Cote   Date of discharge: 10/26/2015  Admitting diagnosis: 37 WKS, LEAKING Intrauterine pregnancy: [redacted]w[redacted]d     Secondary diagnosis:  Active Problems:   Normal labor   NSVD (normal spontaneous vaginal delivery)      Discharge diagnosis: Term Pregnancy Delivered                                                                                                 Hospital course:  Onset of Labor With Vaginal Delivery     30 y.o. yo [id number] at [redacted]w[redacted]d was admitted in Latent Labor with PROM on 10/24/2015. Patient had an uncomplicated labor course as follows:  Membrane Rupture Time/Date: 7:00 AM ,10/24/2015   Intrapartum Procedures: Episiotomy: None [1]                                         Lacerations:  None [1]  Patient had a delivery of a Viable infant. 10/25/2015  Information for the patient's newborn:  Narcisa, Ganesh [[id number]]  Delivery Method: Vaginal, Spontaneous Delivery (Filed from Delivery Summary)    Pateint had an uncomplicated postpartum course.  She is ambulating, tolerating a regular diet, passing flatus, and urinating well. Patient is discharged home in stable condition on 10/26/15.    Physical exam Vitals:   10/25/15 1345 10/25/15 1505 10/25/15 1839 10/26/15 0520  BP: 114/68 113/62 116/67 124/81  Pulse: (!) 57 (!) 59 62 78  Resp: 18 20 18 18   Temp: 98.3 F (36.8 C) 98 F (36.7 C) 98.5 F (36.9 C) 98.4 F (36.9 C)  TempSrc: Oral Oral Oral Oral  SpO2:      Weight:      Height:       General: alert Lochia: appropriate Uterine Fundus: firm  Labs: Lab Results  Component Value Date   WBC 17.4 (H) 10/26/2015   HGB 9.6 (L) 10/26/2015   HCT 28.4 (L) 10/26/2015   MCV 81.8 10/26/2015   PLT 265 10/26/2015   CMP 09/04/2006  Glucose 110(H)  BUN 8  Creatinine 0.90  Sodium 133(L)  Potassium 3.7  Chloride 104  CO2 25   Calcium 8.8  Total Protein 5.5(L)  Total Bilirubin 0.6  Alkaline Phos 68  AST 15  ALT 10    Discharge instruction: per After Visit Summary and "Baby and Me Booklet".  After visit meds:    Medication List    STOP taking these medications   VALTREX 1000 MG tablet Generic drug:  valACYclovir     TAKE these medications   albuterol 108 (90 Base) MCG/ACT inhaler Commonly known as:  PROVENTIL HFA;VENTOLIN HFA Inhale 2 puffs into the lungs every 6 (six) hours as needed for wheezing or shortness of breath.   ibuprofen 600 MG tablet Commonly known as:  ADVIL,MOTRIN Take 1 tablet (600 mg total) by mouth every 6 (six) hours.  Diet: routine diet  Activity: Advance as tolerated. Pelvic rest for 6 weeks.   Outpatient follow up:4 weeks   Newborn Data: Live born female  Birth Weight: 6 lb 4.9 oz (2860 g) APGAR: 9, 9  Baby Feeding: Breast Disposition:home with mother   10/26/2015 Zenaida NieceMEISINGER,Shallyn Constancio D, MD

## 2015-10-26 NOTE — Progress Notes (Signed)
PPD #1 No problems, wants to go home today Afeb, VSS Fundus firm, NT at U-1 Continue routine postpartum care, d/c home if baby ok to go  

## 2015-10-26 NOTE — Progress Notes (Signed)
Pt changed her mind and does not want to go home, will continue routine postpartum care

## 2015-10-26 NOTE — Discharge Instructions (Signed)
As per discharge pamphlet °

## 2015-10-27 MED ORDER — IBUPROFEN 800 MG PO TABS: 800.0000 mg | ORAL_TABLET | Freq: Three times a day (TID) | ORAL | 1 refills | Status: DC | PRN

## 2015-10-27 NOTE — Progress Notes (Signed)
Post Partum Day 2 Subjective: no complaints, up ad lib and tolerating PO  Objective: Blood pressure 125/75, pulse 64, temperature 97.9 F (36.6 C), temperature source Oral, resp. rate 18, height 5\' 1"  (1.549 m), weight 161 lb (73 kg), SpO2 100 %, unknown if currently breastfeeding.  Physical Exam:  General: alert, cooperative and no distress Lochia: appropriate Uterine Fundus: firm Incision: n/a DVT Evaluation: Negative Homan's sign. No significant calf/ankle edema.   Recent Labs  10/25/15 0045 10/26/15 0534  HGB 10.7* 9.6*  HCT 31.6* 28.4*    Assessment/Plan: Discharge home - likely nest due to baby under bili lights Bottlefeeding Plans to do circ in office  [facility]    LOS: 2 days   Edwinna AreolaCecilia Worema Gay Rape 10/27/2015, 9:43 AM

## 2015-11-06 ENCOUNTER — Telehealth (HOSPITAL_COMMUNITY): Payer: Self-pay

## 2016-03-11 ENCOUNTER — Ambulatory Visit (INDEPENDENT_AMBULATORY_CARE_PROVIDER_SITE_OTHER): Payer: Medicaid Other

## 2016-03-11 ENCOUNTER — Ambulatory Visit (INDEPENDENT_AMBULATORY_CARE_PROVIDER_SITE_OTHER): Payer: Medicaid Other | Admitting: Podiatry

## 2016-03-11 ENCOUNTER — Encounter: Payer: Self-pay | Admitting: Podiatry

## 2016-03-11 ENCOUNTER — Ambulatory Visit: Payer: Medicaid Other

## 2016-03-11 VITALS — BP 119/81 | HR 110 | Resp 16 | Ht 63.0 in | Wt 160.0 lb

## 2016-03-11 DIAGNOSIS — M2042 Other hammer toe(s) (acquired), left foot: Secondary | ICD-10-CM

## 2016-03-11 DIAGNOSIS — M2040 Other hammer toe(s) (acquired), unspecified foot: Secondary | ICD-10-CM | POA: Diagnosis not present

## 2016-03-11 DIAGNOSIS — M79671 Pain in right foot: Secondary | ICD-10-CM

## 2016-03-11 DIAGNOSIS — M2041 Other hammer toe(s) (acquired), right foot: Secondary | ICD-10-CM

## 2016-03-11 DIAGNOSIS — M79672 Pain in left foot: Secondary | ICD-10-CM

## 2016-03-11 NOTE — Progress Notes (Signed)
   Subjective:    Patient ID: Deborah Brock, female    DOB: Feb 19, 1985, 31 y.o.   MRN: [id number]  HPI  Chief Complaint  Patient presents with  . Foot Pain    BL; Plantar Forefoot x 6 months.   . Toe Pain    BL; 2nd toes are curved. Pt states that she "is unable to wear heels and when she does it puts pressure on the toes and causes pain"        Review of Systems     Objective:   Physical Exam        Assessment & Plan:

## 2016-03-12 NOTE — Progress Notes (Signed)
Subjective:     Patient ID: Deborah Brock, female   DOB: [birth year], 31 y.o.   MRN: [id number]  Patient presents with pain of the second digits bilateral with deformity and enlargement of the distal joints. States she has trouble wearing certain shoe gears and that it's gradually becoming more of an issue for her     Review of Systems  All other systems reviewed and are negative.      Objective:   Physical Exam  Constitutional: She is oriented to person, place, and time.  Musculoskeletal: Normal range of motion.  Neurological: She is oriented to person, place, and time.  Skin: Skin is warm.  Nursing note and vitals reviewed. 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 I do think if I can remove a portion of the middle phalanx that I can align the toe better even though it'll never be perfect. Patient wants this done I explained procedure and risk and she will reappoint for consult  X-rays indicate damage of the interphalangeal joint distal  second digit bilateral

## 2016-03-18 ENCOUNTER — Ambulatory Visit: Payer: Medicaid Other | Admitting: Podiatry

## 2016-03-28 ENCOUNTER — Ambulatory Visit (INDEPENDENT_AMBULATORY_CARE_PROVIDER_SITE_OTHER): Payer: Medicaid Other | Admitting: Podiatry

## 2016-03-28 ENCOUNTER — Encounter: Payer: Self-pay | Admitting: Podiatry

## 2016-03-28 VITALS — BP 117/77 | HR 89 | Resp 16

## 2016-03-28 DIAGNOSIS — M2041 Other hammer toe(s) (acquired), right foot: Secondary | ICD-10-CM

## 2016-03-28 DIAGNOSIS — M2040 Other hammer toe(s) (acquired), unspecified foot: Secondary | ICD-10-CM | POA: Diagnosis not present

## 2016-03-28 DIAGNOSIS — M2042 Other hammer toe(s) (acquired), left foot: Secondary | ICD-10-CM

## 2016-03-28 NOTE — Patient Instructions (Signed)
Pre-Operative Instructions  Congratulations, you have decided to take an important step to improving your quality of life.  You can be assured that the doctors of [facility] will be with you every step of the way.  1. Plan to be at the surgery center/hospital at least 1 (one) hour prior to your scheduled time unless otherwise directed by the surgical center/hospital staff.  You must have a responsible adult accompany you, remain during the surgery and drive you home.  Make sure you have directions to the surgical center/hospital and know how to get there on time. 2. For hospital based surgery you will need to obtain a history and physical form from your family physician within 1 month prior to the date of surgery- we will give you a form for you primary physician.  3. We make every effort to accommodate the date you request for surgery.  There are however, times where surgery dates or times have to be moved.  We will contact you as soon as possible if a change in schedule is required.   4. No Aspirin/Ibuprofen for one week before surgery.  If you are on aspirin, any non-steroidal anti-inflammatory medications (Mobic, Aleve, Ibuprofen) you should stop taking it 7 days prior to your surgery.  You make take Tylenol  For pain prior to surgery.  5. Medications- If you are taking daily heart and blood pressure medications, seizure, reflux, allergy, asthma, anxiety, pain or diabetes medications, make sure the surgery center/hospital is aware before the day of surgery so they may notify you which medications to take or avoid the day of surgery. 6. No food or drink after midnight the night before surgery unless directed otherwise by surgical center/hospital staff. 7. No alcoholic beverages 24 hours prior to surgery.  No smoking 24 hours prior to or 24 hours after surgery. 8. Wear loose pants or shorts- loose enough to fit over bandages, boots, and casts. 9. No slip on shoes, sneakers are best. 10. Bring  your boot with you to the surgery center/hospital.  Also bring crutches or a walker if your physician has prescribed it for you.  If you do not have this equipment, it will be provided for you after surgery. 11. If you have not been contracted by the surgery center/hospital by the day before your surgery, call to confirm the date and time of your surgery. 12. Leave-time from work may vary depending on the type of surgery you have.  Appropriate arrangements should be made prior to surgery with your employer. 13. Prescriptions will be provided immediately following surgery by your doctor.  Have these filled as soon as possible after surgery and take the medication as directed. 14. Remove nail polish on the operative foot. 15. Wash the night before surgery.  The night before surgery wash the foot and leg well with the antibacterial soap provided and water paying special attention to beneath the toenails and in between the toes.  Rinse thoroughly with water and dry well with a towel.  Perform this wash unless told not to do so by your physician.  Enclosed: 1 Ice pack (please put in freezer the night before surgery)   1 Hibiclens skin cleaner   Pre-op Instructions  If you have any questions regarding the instructions, do not hesitate to call our office.  [place]: [address] [place], [place] [address] [phone]  [place]: [address] [place], [place] [address] [phone]  [place]: [address]  [place], [place] [address] [phone]   Dr.   Norman Regal DPM, Dr. Matthew Wagoner DPM, Dr. M. Todd Hyatt DPM, Dr. Titorya Stover DPM 

## 2016-03-30 NOTE — Progress Notes (Signed)
Subjective:     Patient ID: Deborah Brock, female   DOB: 21-Sep-1985, 31 y.o.   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 Patient understands this completely and I allowed her to read consent form reviewing alternative treatments and complications associated with procedure and the fact that recovery can take a proximally 6 months for this type procedure. Patient understands all complications understands the fact the toes will probably not be incomplete alignment but I'm hoping they will be improved and she signs consent form and is scheduled for outpatient surgery

## 2016-04-12 ENCOUNTER — Encounter: Payer: Self-pay | Admitting: Podiatry

## 2016-04-12 DIAGNOSIS — M2041 Other hammer toe(s) (acquired), right foot: Secondary | ICD-10-CM

## 2016-04-12 DIAGNOSIS — M2042 Other hammer toe(s) (acquired), left foot: Secondary | ICD-10-CM | POA: Diagnosis not present

## 2016-04-14 ENCOUNTER — Ambulatory Visit (INDEPENDENT_AMBULATORY_CARE_PROVIDER_SITE_OTHER): Payer: Self-pay | Admitting: Podiatry

## 2016-04-14 ENCOUNTER — Encounter: Payer: Self-pay | Admitting: Podiatry

## 2016-04-14 VITALS — BP 118/78 | HR 75 | Temp 99.2°F | Resp 16

## 2016-04-14 DIAGNOSIS — M2041 Other hammer toe(s) (acquired), right foot: Secondary | ICD-10-CM

## 2016-04-15 NOTE — Progress Notes (Signed)
Subjective:     Patient ID: Deborah Brock, female   DOB: Mar 30, 1985, 31 y.o.   MRN: [id number]  HPI patient presents concerned about bleeding second toe left   Review of Systems     Objective:   Physical Exam Fill time the digits normal dressings are intact with a crusted dressing second toe left it's keeping the toe in satisfactory position    Assessment:     Dried blood left second digit    Plan:     Do not recommend removal currently as it's holding the toe in proper position and it is sterile. It is not tender and I do not think be a problem but patient's encouraged to call if any issues should occur

## 2016-04-21 ENCOUNTER — Ambulatory Visit (INDEPENDENT_AMBULATORY_CARE_PROVIDER_SITE_OTHER): Payer: Medicaid Other | Admitting: Podiatry

## 2016-04-21 ENCOUNTER — Ambulatory Visit (INDEPENDENT_AMBULATORY_CARE_PROVIDER_SITE_OTHER): Payer: Medicaid Other

## 2016-04-21 ENCOUNTER — Telehealth: Payer: Self-pay | Admitting: *Deleted

## 2016-04-21 DIAGNOSIS — M2041 Other hammer toe(s) (acquired), right foot: Secondary | ICD-10-CM | POA: Diagnosis not present

## 2016-04-21 DIAGNOSIS — M2042 Other hammer toe(s) (acquired), left foot: Secondary | ICD-10-CM | POA: Diagnosis not present

## 2016-04-21 MED ORDER — HYDROCODONE-ACETAMINOPHEN 10-325 MG PO TABS: 1.0000 | ORAL_TABLET | Freq: Four times a day (QID) | ORAL | 0 refills | Status: DC | PRN

## 2016-04-21 NOTE — Telephone Encounter (Signed)
[facility] states would like to verify Norco 10/325 for pt, the rx was written on. I reveiwed Norco 10/325 #20 one tablet every 6 hours prn foot pain and [facility] with the instructions and she stated that was what she had.

## 2016-04-25 NOTE — Progress Notes (Signed)
She presents today little more than a week status post DIPJ arthroplasties second toes bilateral. She denies fever chills nausea vomiting muscle aches and pains.  Objective: Vital signs are stable she is alert and oriented 3. Dry sterile dressings was removed demonstrates no erythema edema and cellulitis drainage or odor. Toes are sitting rectus sutures are intact margins are coapting. Radiographs demonstrate well-healing arthroplasties no osseous abnormalities.  Assessment: Well-healed surgical toes second bilateral.  Plan: Reappoint one week for suture removal. Redressed today dry sterile compressive dressing continue to ice and elevate. Continue to use the Darco shoes regularly.

## 2016-04-28 ENCOUNTER — Ambulatory Visit (INDEPENDENT_AMBULATORY_CARE_PROVIDER_SITE_OTHER): Payer: Self-pay | Admitting: Podiatry

## 2016-04-28 DIAGNOSIS — M2041 Other hammer toe(s) (acquired), right foot: Secondary | ICD-10-CM

## 2016-04-28 DIAGNOSIS — M2042 Other hammer toe(s) (acquired), left foot: Secondary | ICD-10-CM

## 2016-04-29 NOTE — Progress Notes (Signed)
Subjective:     Patient ID: Deborah Brock, female   DOB: 1985/06/09, 31 y.o.   MRN: [id number]  HPI patient states that she's doing real well with minimal discomfort   Review of Systems     Objective:   Physical Exam Neurovascular status intact with patient second digit healing well with wound edges well coapted and good alignment    Assessment:     Doing well post hammertoe repair second bilateral    Plan:     Advised on stitch removal which was accomplished today continuing with compression and not wearing shoes and continue with wider shoe type. Patient will be rechecked

## 2016-07-21 NOTE — Progress Notes (Signed)
1. Hammertoe repair with removal bone distal 2nd toe both feet

## 2017-11-14 HISTORY — PX: OTHER SURGICAL HISTORY: SHX169

## 2017-11-14 HISTORY — PX: LIPOSUCTION: SHX10

## 2019-07-27 ENCOUNTER — Emergency Department (HOSPITAL_BASED_OUTPATIENT_CLINIC_OR_DEPARTMENT_OTHER)
Admission: EM | Admit: 2019-07-27 | Discharge: 2019-07-28 | Disposition: A | Discharge status: Home / Self Care | Payer: Medicaid Other | Attending: Emergency Medicine | Admitting: Emergency Medicine

## 2019-07-27 ENCOUNTER — Emergency Department (HOSPITAL_BASED_OUTPATIENT_CLINIC_OR_DEPARTMENT_OTHER): Payer: Medicaid Other

## 2019-07-27 ENCOUNTER — Encounter (HOSPITAL_BASED_OUTPATIENT_CLINIC_OR_DEPARTMENT_OTHER): Payer: Self-pay | Admitting: Emergency Medicine

## 2019-07-27 ENCOUNTER — Other Ambulatory Visit: Payer: Self-pay

## 2019-07-27 DIAGNOSIS — F17200 Nicotine dependence, unspecified, uncomplicated: Secondary | ICD-10-CM | POA: Insufficient documentation

## 2019-07-27 DIAGNOSIS — I10 Essential (primary) hypertension: Secondary | ICD-10-CM | POA: Diagnosis not present

## 2019-07-27 DIAGNOSIS — R072 Precordial pain: Secondary | ICD-10-CM

## 2019-07-27 DIAGNOSIS — R05 Cough: Secondary | ICD-10-CM | POA: Insufficient documentation

## 2019-07-27 DIAGNOSIS — Z20822 Contact with and (suspected) exposure to covid-19: Secondary | ICD-10-CM

## 2019-07-27 DIAGNOSIS — R059 Cough, unspecified: Secondary | ICD-10-CM

## 2019-07-27 DIAGNOSIS — F1210 Cannabis abuse, uncomplicated: Secondary | ICD-10-CM | POA: Insufficient documentation

## 2019-07-27 DIAGNOSIS — R079 Chest pain, unspecified: Secondary | ICD-10-CM | POA: Diagnosis present

## 2019-07-27 HISTORY — DX: Essential (primary) hypertension: I10

## 2019-07-27 LAB — BASIC METABOLIC PANEL
Anion gap: 9 (ref 5–15)
BUN: 7 mg/dL (ref 6–20)
CO2: 24 mmol/L (ref 22–32)
Calcium: 8.1 mg/dL — ABNORMAL LOW (ref 8.9–10.3)
Chloride: 106 mmol/L (ref 98–111)
Creatinine, Ser: 1.09 mg/dL — ABNORMAL HIGH (ref 0.44–1.00)
GFR calc Af Amer: >60 mL/min (ref >60)
GFR calc non Af Amer: >60 mL/min (ref >60)
Glucose, Bld: 122 mg/dL — ABNORMAL HIGH (ref 70–99)
Potassium: 3.5 mmol/L (ref 3.5–5.1)
Sodium: 139 mmol/L (ref 135–145)

## 2019-07-27 LAB — CBC
HCT: 41.9 % (ref 36.0–46.0)
Hemoglobin: 13.9 g/dL (ref 12.0–15.0)
MCH: 28.6 pg (ref 26.0–34.0)
MCHC: 33.2 g/dL (ref 30.0–36.0)
MCV: 86.2 fL (ref 80.0–100.0)
Platelets: 329 10*3/uL (ref 150–400)
RBC: 4.86 MIL/uL (ref 3.87–5.11)
RDW: 12.6 % (ref 11.5–15.5)
WBC: 8.8 10*3/uL (ref 4.0–10.5)
nRBC: 0 % (ref 0.0–0.2)

## 2019-07-27 LAB — PREGNANCY, URINE: Preg Test, Ur: NEGATIVE

## 2019-07-27 LAB — TROPONIN I (HIGH SENSITIVITY): Troponin I (High Sensitivity): 5 ng/L (ref <18)

## 2019-07-27 MED ORDER — SODIUM CHLORIDE 0.9% FLUSH
3.0000 mL | Freq: Once | INTRAVENOUS | Status: AC
  Administered 2019-07-27: 3 mL via INTRAVENOUS
  Filled 2019-07-27: qty 3

## 2019-07-27 MED ORDER — KETOROLAC TROMETHAMINE 30 MG/ML IJ SOLN
30.0000 mg | Freq: Once | INTRAMUSCULAR | Status: AC
  Administered 2019-07-27: 30 mg via INTRAVENOUS
  Filled 2019-07-27: qty 1

## 2019-07-27 MED ORDER — ALUM & MAG HYDROXIDE-SIMETH 200-200-20 MG/5ML PO SUSP
30.0000 mL | Freq: Once | ORAL | Status: AC
  Administered 2019-07-27: 30 mL via ORAL
  Filled 2019-07-27: qty 30

## 2019-07-27 MED ORDER — IOHEXOL 350 MG/ML SOLN
100.0000 mL | Freq: Once | INTRAVENOUS | Status: AC | PRN
  Administered 2019-07-27: 100 mL via INTRAVENOUS

## 2019-07-27 NOTE — ED Notes (Signed)
PT asked triage nurse to remove her IV so that she could leave.

## 2019-07-27 NOTE — ED Triage Notes (Signed)
Pt states she woke up yesterday with a cough and developed pain over L breast last night that worsens with deep inspiration. Reports the pain as constant and sharp and rates it 8/10. Denies fever. Pt states she is a smoker, has an implant for birth control, and denies recent travel.

## 2019-07-28 ENCOUNTER — Telehealth (HOSPITAL_BASED_OUTPATIENT_CLINIC_OR_DEPARTMENT_OTHER): Payer: Self-pay | Admitting: Emergency Medicine

## 2019-07-28 ENCOUNTER — Encounter (HOSPITAL_BASED_OUTPATIENT_CLINIC_OR_DEPARTMENT_OTHER): Payer: Self-pay | Admitting: Emergency Medicine

## 2019-07-28 LAB — TROPONIN I (HIGH SENSITIVITY): Troponin I (High Sensitivity): 5 ng/L (ref <18)

## 2019-07-28 MED ORDER — BENZONATATE 100 MG PO CAPS
100.0000 mg | ORAL_CAPSULE | Freq: Three times a day (TID) | ORAL | 0 refills | Status: DC
Start: 2019-07-28 — End: 2019-12-19

## 2019-07-28 NOTE — ED Provider Notes (Signed)
[facility] EMERGENCY DEPARTMENT Provider Note   CSN: [id number] Arrival date & time: 07/27/19  2052     History Chief Complaint  Patient presents with  . Chest Pain    Deborah Brock is a 34 y.o. female.  The history is provided by the patient.  Chest Pain Pain location:  L chest Pain quality: sharp   Pain radiates to:  Does not radiate Pain severity:  Moderate Onset quality:  Gradual Duration:  1 day Timing:  Constant Progression:  Unchanged Chronicity:  New Context: breathing   Context comment:  Cough Relieved by:  Nothing Worsened by:  Nothing Ineffective treatments:  None tried Associated symptoms: no abdominal pain, no AICD problem, no altered mental status, no anorexia, no anxiety, no back pain, no claudication, no cough, no diaphoresis, no dizziness, no dysphagia, no fatigue, no fever, no headache, no heartburn, no lower extremity edema, no nausea, no near-syncope, no numbness, no orthopnea, no palpitations, no PND, no syncope, no vomiting and no weakness   Risk factors: birth control   Risk factors: no aortic disease   Is not covid vaccinated. Denies exposure.  No leg pain nor swelling.       Past Medical History:  Diagnosis Date  . Hypertension   . No pertinent past medical history     Patient Active Problem List   Diagnosis Date Noted  . Normal labor 10/25/2015  . NSVD (normal spontaneous vaginal delivery) 10/25/2015  . Supervision of low-risk pregnancy 03/10/2015  . Vitamin D deficiency 03/10/2015    Past Surgical History:  Procedure Laterality Date  . BREAST SURGERY     Augmentation 10/2013  . THERAPEUTIC ABORTION     May 2016  . TONSILLECTOMY       OB History    Gravida  7   Para  3   Term  3   Preterm  0   AB  4   Living  3     SAB  2   TAB  2   Ectopic  0   Multiple  0   Live Births  1           Family History  Problem Relation Age of Onset  . Hypertension Mother   . Diabetes Mother     Social  History   Tobacco Use  . Smoking status: Current Every Day Smoker  . Smokeless tobacco: Never Used  Substance Use Topics  . Alcohol use: No    Comment: socially  . Drug use: Yes    Types: Marijuana    Comment: every other day    Home Medications Prior to Admission medications   Medication Sig Start Date End Date Taking? Authorizing Provider  albuterol (PROVENTIL HFA;VENTOLIN HFA) 108 (90 BASE) MCG/ACT inhaler Inhale 2 puffs into the lungs every 6 (six) hours as needed for wheezing or shortness of breath.     [provider]  benzonatate (TESSALON) 100 MG capsule Take 1 capsule (100 mg total) by mouth every 8 (eight) hours. 07/28/19   Kristin Lamagna, MD  etonogestrel (NEXPLANON) 68 MG IMPL implant 1 each by Subdermal route once.    [provider]  HYDROcodone-acetaminophen (NORCO) 10-325 MG tablet Take 1 tablet by mouth every 6 (six) hours as needed. 04/21/16   Hyatt, Max T, DPM  ibuprofen (ADVIL,MOTRIN) 800 MG tablet Take 1 tablet (800 mg total) by mouth every 8 (eight) hours as needed for mild pain, moderate pain or cramping. 10/27/15   Edwinna Areola,  DO    Allergies    Hydrocodone-acetaminophen  Review of Systems   Review of Systems  Constitutional: Negative for diaphoresis, fatigue and fever.  HENT: Negative for trouble swallowing.   Eyes: Negative for visual disturbance.  Respiratory: Negative for cough.   Cardiovascular: Positive for chest pain. Negative for palpitations, orthopnea, claudication, syncope, PND and near-syncope.  Gastrointestinal: Negative for abdominal pain, anorexia, heartburn, nausea and vomiting.  Genitourinary: Negative for difficulty urinating.  Musculoskeletal: Negative for back pain.  Neurological: Negative for dizziness, weakness, numbness and headaches.  Psychiatric/Behavioral: Negative for agitation.  All other systems reviewed and are negative.   Physical Exam Updated Vital Signs BP (!) 136/105   Pulse 85   Temp 97.8  F (36.6 C) (Oral)   Resp 18   SpO2 95%   Physical Exam Vitals and nursing note reviewed.  Constitutional:      General: She is not in acute distress.    Appearance: Normal appearance.  HENT:     Head: Normocephalic and atraumatic.     Nose: Nose normal.  Eyes:     Conjunctiva/sclera: Conjunctivae normal.     Pupils: Pupils are equal, round, and reactive to light.  Cardiovascular:     Rate and Rhythm: Normal rate and regular rhythm.     Pulses: Normal pulses.     Heart sounds: Normal heart sounds.  Pulmonary:     Effort: Pulmonary effort is normal.     Breath sounds: Normal breath sounds.  Abdominal:     General: Abdomen is flat. Bowel sounds are normal.     Tenderness: There is no abdominal tenderness. There is no guarding or rebound.  Musculoskeletal:        General: Normal range of motion.     Cervical back: Normal range of motion and neck supple.  Skin:    General: Skin is warm and dry.     Capillary Refill: Capillary refill takes less than 2 seconds.  Neurological:     General: No focal deficit present.     Mental Status: She is alert and oriented to person, place, and time.     Deep Tendon Reflexes: Reflexes normal.  Psychiatric:        Mood and Affect: Mood normal.        Behavior: Behavior normal.     ED Results / Procedures / Treatments   Labs (all labs ordered are listed, but only abnormal results are displayed) Results for orders placed or performed during the hospital encounter of 52/84/13  Basic metabolic panel  Result Value Ref Range   Sodium 139 135 - 145 mmol/L   Potassium 3.5 3.5 - 5.1 mmol/L   Chloride 106 98 - 111 mmol/L   CO2 24 22 - 32 mmol/L   Glucose, Bld 122 (H) 70 - 99 mg/dL   BUN 7 6 - 20 mg/dL   Creatinine, Ser 1.09 (H) 0.44 - 1.00 mg/dL   Calcium 8.1 (L) 8.9 - 10.3 mg/dL   GFR calc non Af Amer >60 >60 mL/min   GFR calc Af Amer >60 >60 mL/min   Anion gap 9 5 - 15  CBC  Result Value Ref Range   WBC 8.8 4.0 - 10.5 K/uL   RBC 4.86  3.87 - 5.11 MIL/uL   Hemoglobin 13.9 12.0 - 15.0 g/dL   HCT 41.9 36 - 46 %   MCV 86.2 80.0 - 100.0 fL   MCH 28.6 26.0 - 34.0 pg   MCHC 33.2 30.0 - 36.0 g/dL  RDW 12.6 11.5 - 15.5 %   Platelets 329 150 - 400 K/uL   nRBC 0.0 0.0 - 0.2 %  Pregnancy, urine  Result Value Ref Range   Preg Test, Ur NEGATIVE NEGATIVE  Troponin I (High Sensitivity)  Result Value Ref Range   Troponin I (High Sensitivity) 5 <18 ng/L  Troponin I (High Sensitivity)  Result Value Ref Range   Troponin I (High Sensitivity) 5 <18 ng/L   DG Chest 2 View  Result Date: 07/27/2019 CLINICAL DATA:  Chest pain and shortness of breath EXAM: CHEST - 2 VIEW COMPARISON:  04/26/2006 FINDINGS: The heart size and mediastinal contours are within normal limits. Both lungs are clear. The visualized skeletal structures are unremarkable. Bilateral breast implants are noted. IMPRESSION: No active cardiopulmonary disease. Electronically Signed   By: Alcide Clever M.D.   On: 07/27/2019 21:46   CT Angio Chest PE W and/or Wo Contrast  Result Date: 07/28/2019 CLINICAL DATA:  Cough, left chest pain EXAM: CT ANGIOGRAPHY CHEST WITH CONTRAST TECHNIQUE: Multidetector CT imaging of the chest was performed using the standard protocol during bolus administration of intravenous contrast. Multiplanar CT image reconstructions and MIPs were obtained to evaluate the vascular anatomy. CONTRAST:  OMNIPAQUE IOHEXOL 350 MG/ML SOLN COMPARISON:  Chest x-ray today FINDINGS: Cardiovascular: No filling defects in the pulmonary arteries to suggest pulmonary emboli. Heart is normal size. Aorta is normal caliber. Mediastinum/Nodes: No mediastinal, hilar, or axillary adenopathy. Trachea and esophagus are unremarkable. Thyroid unremarkable. Lungs/Pleura: Lungs are clear. No focal airspace opacities or suspicious nodules. No effusions. Upper Abdomen: Imaging into the upper abdomen shows no acute findings. Musculoskeletal: Bilateral breast implants. No acute bony  abnormality. Review of the MIP images confirms the above findings. IMPRESSION: No acute cardiopulmonary disease. Electronically Signed   By: Charlett Nose M.D.   On: 07/28/2019 00:10    EKG EKG Interpretation  Date/Time:  Saturday July 27 2019 21:10:55 EDT Ventricular Rate:  102 PR Interval:  130 QRS Duration: 96 QT Interval:  388 QTC Calculation: 505 R Axis:   46 Text Interpretation: Sinus tachycardia Incomplete right bundle branch block Confirmed by Nicanor Alcon, Makailyn Mccormick ([id number]) on 07/27/2019 11:04:12 PM   Radiology DG Chest 2 View  Result Date: 07/27/2019 CLINICAL DATA:  Chest pain and shortness of breath EXAM: CHEST - 2 VIEW COMPARISON:  04/26/2006 FINDINGS: The heart size and mediastinal contours are within normal limits. Both lungs are clear. The visualized skeletal structures are unremarkable. Bilateral breast implants are noted. IMPRESSION: No active cardiopulmonary disease. Electronically Signed   By: Alcide Clever M.D.   On: 07/27/2019 21:46   CT Angio Chest PE W and/or Wo Contrast  Result Date: 07/28/2019 CLINICAL DATA:  Cough, left chest pain EXAM: CT ANGIOGRAPHY CHEST WITH CONTRAST TECHNIQUE: Multidetector CT imaging of the chest was performed using the standard protocol during bolus administration of intravenous contrast. Multiplanar CT image reconstructions and MIPs were obtained to evaluate the vascular anatomy. CONTRAST:  OMNIPAQUE IOHEXOL 350 MG/ML SOLN COMPARISON:  Chest x-ray today FINDINGS: Cardiovascular: No filling defects in the pulmonary arteries to suggest pulmonary emboli. Heart is normal size. Aorta is normal caliber. Mediastinum/Nodes: No mediastinal, hilar, or axillary adenopathy. Trachea and esophagus are unremarkable. Thyroid unremarkable. Lungs/Pleura: Lungs are clear. No focal airspace opacities or suspicious nodules. No effusions. Upper Abdomen: Imaging into the upper abdomen shows no acute findings. Musculoskeletal: Bilateral breast implants. No acute bony  abnormality. Review of the MIP images confirms the above findings. IMPRESSION: No acute cardiopulmonary disease. Electronically Signed  By: Charlett Nose M.D.   On: 07/28/2019 00:10    Procedures Procedures (including critical care time)  Medications Ordered in ED Medications  sodium chloride flush (NS) 0.9 % injection 3 mL (3 mLs Intravenous Given 07/27/19 2319)  ketorolac (TORADOL) 30 MG/ML injection 30 mg (30 mg Intravenous Given 07/27/19 2318)  alum & mag hydroxide-simeth (MAALOX/MYLANTA) 200-200-20 MG/5ML suspension 30 mL (30 mLs Oral Given 07/27/19 2318)  iohexol (OMNIPAQUE) 350 MG/ML injection 100 mL (100 mLs Intravenous Contrast Given 07/27/19 2348)    ED Course  I have reviewed the triage vital signs and the nursing notes.  Pertinent labs & imaging results that were available during my care of the patient were reviewed by me and considered in my medical decision making (see chart for details).    Ruled out for MI and PE in the ED.  HEART score is 1 very low risk for MACE.  Symptoms are highly atypical of cardiac etiology.    I suspect this is covid or a viral illness.  I ordered a covid swab but the patient is refusing this test in the ED and has been placed in strict home isolation for 10 days from the start of symptoms and has been told to wear a mask at all times.  This is on yher discharge paperwork.    Carlia Bomkamp was evaluated in Emergency Department on 07/28/2019 for the symptoms described in the history of present illness. She was evaluated in the context of the global COVID-19 pandemic, which necessitated consideration that the patient might be at risk for infection with the SARS-CoV-2 virus that causes COVID-19. Institutional protocols and algorithms that pertain to the evaluation of patients at risk for COVID-19 are in a state of rapid change based on information released by regulatory bodies including the CDC and federal and state organizations. These policies and algorithms  were followed during the patient's care in the ED.  Final Clinical Impression(s) / ED Diagnoses Final diagnoses:  Cough  Person under investigation for COVID-19  Precordial pain   Return for intractable cough, coughing up blood,fevers >100.4 unrelieved by medication, shortness of breath, intractable vomiting, chest pain, shortness of breath, weakness,numbness, changes in speech, facial asymmetry,abdominal pain, passing out,Inability to tolerate liquids or food, cough, altered mental status or any concerns. No signs of systemic illness or infection. The patient is nontoxic-appearing on exam and vital signs are within normal limits.   I have reviewed the triage vital signs and the nursing notes. Pertinent labs &imaging results that were available during my care of the patient were reviewed by me and considered in my medical decision making (see chart for details).After history, exam, and medical workup I feel the patient has beenappropriately medically screened and is safe for discharge home. Pertinent diagnoses were discussed with the patient. Patient was given return precautions. Rx / DC Orders ED Discharge Orders         Ordered    benzonatate (TESSALON) 100 MG capsule  Every 8 hours     Discontinue  Reprint     07/28/19 0023           Neziah Braley, MD 07/28/19 1610

## 2019-07-28 NOTE — ED Notes (Signed)
Discharge reviewed with patient.  Dr Nicanor Alcon at the bedside.  Patient refusing to do covid swab.  Unable to convince her otherwise.  Left with boyfriend at this time.

## 2019-07-28 NOTE — Discharge Instructions (Signed)
Person Under Monitoring Name: Deborah Brock  Location: [address] [place] [address]   Infection Prevention Recommendations for Individuals Confirmed to have, or Being Evaluated for, 2019 Novel Coronavirus (COVID-19) Infection Who Receive Care at Home  Individuals who are confirmed to have, or are being evaluated for, COVID-19 should follow the prevention steps below until a healthcare provider or local or state health department says they can return to normal activities.  Stay home except to get medical care You should restrict activities outside your home, except for getting medical care. Do not go to work, school, or public areas, and do not use public transportation or taxis.  Call ahead before visiting your doctor Before your medical appointment, call the healthcare provider and tell them that you have, or are being evaluated for, COVID-19 infection. This will help the healthcare providers office take steps to keep other people from getting infected. Ask your healthcare provider to call the local or state health department.  Monitor your symptoms Seek prompt medical attention if your illness is worsening (e.g., difficulty breathing). Before going to your medical appointment, call the healthcare provider and tell them that you have, or are being evaluated for, COVID-19 infection. Ask your healthcare provider to call the local or state health department.  Wear a facemask You should wear a facemask that covers your nose and mouth when you are in the same room with other people and when you visit a healthcare provider. People who live with or visit you should also wear a facemask while they are in the same room with you.  Separate yourself from other people in your home As much as possible, you should stay in a different room from other people in your home. Also, you should use a separate bathroom, if available.  Avoid sharing household items You should not share  dishes, drinking glasses, cups, eating utensils, towels, bedding, or other items with other people in your home. After using these items, you should wash them thoroughly with soap and water.  Cover your coughs and sneezes Cover your mouth and nose with a tissue when you cough or sneeze, or you can cough or sneeze into your sleeve. Throw used tissues in a lined trash can, and immediately wash your hands with soap and water for at least 20 seconds or use an alcohol-based hand rub.  Wash your [employer] your hands often and thoroughly with soap and water for at least 20 seconds. You can use an alcohol-based hand sanitizer if soap and water are not available and if your hands are not visibly dirty. Avoid touching your eyes, nose, and mouth with unwashed hands.   Prevention Steps for Caregivers and Household Members of Individuals Confirmed to have, or Being Evaluated for, COVID-19 Infection Being Cared for in the Home  If you live with, or provide care at home for, a person confirmed to have, or being evaluated for, COVID-19 infection please follow these guidelines to prevent infection:  Follow healthcare providers instructions Make sure that you understand and can help the patient follow any healthcare provider instructions for all care.  Provide for the patients basic needs You should help the patient with basic needs in the home and provide support for getting groceries, prescriptions, and other personal needs.  Monitor the patients symptoms If they are getting sicker, call his or her medical provider and tell them that the patient has, or is being evaluated for, COVID-19 infection. This will help the healthcare providers office take  steps to keep other people from getting infected. Ask the healthcare provider to call the local or state health department.  Limit the number of people who have contact with the patient If possible, have only one caregiver for the patient. Other  household members should stay in another home or place of residence. If this is not possible, they should stay in another room, or be separated from the patient as much as possible. Use a separate bathroom, if available. Restrict visitors who do not have an essential need to be in the home.  Keep older adults, very young children, and other sick people away from the patient Keep older adults, very young children, and those who have compromised immune systems or chronic health conditions away from the patient. This includes people with chronic heart, lung, or kidney conditions, diabetes, and cancer.  Ensure good ventilation Make sure that shared spaces in the home have good air flow, such as from an air conditioner or an opened window, weather permitting.  Wash your hands often Wash your hands often and thoroughly with soap and water for at least 20 seconds. You can use an alcohol based hand sanitizer if soap and water are not available and if your hands are not visibly dirty. Avoid touching your eyes, nose, and mouth with unwashed hands. Use disposable paper towels to dry your hands. If not available, use dedicated cloth towels and replace them when they become wet.  Wear a facemask and gloves Wear a disposable facemask at all times in the room and gloves when you touch or have contact with the patients blood, body fluids, and/or secretions or excretions, such as sweat, saliva, sputum, nasal mucus, vomit, urine, or feces.  Ensure the mask fits over your nose and mouth tightly, and do not touch it during use. Throw out disposable facemasks and gloves after using them. Do not reuse. Wash your hands immediately after removing your facemask and gloves. If your personal clothing becomes contaminated, carefully remove clothing and launder. Wash your hands after handling contaminated clothing. Place all used disposable facemasks, gloves, and other waste in a lined container before disposing them with  other household waste. Remove gloves and wash your hands immediately after handling these items.  Do not share dishes, glasses, or other household items with the patient Avoid sharing household items. You should not share dishes, drinking glasses, cups, eating utensils, towels, bedding, or other items with a patient who is confirmed to have, or being evaluated for, COVID-19 infection. After the person uses these items, you should wash them thoroughly with soap and water.  Wash laundry thoroughly Immediately remove and wash clothes or bedding that have blood, body fluids, and/or secretions or excretions, such as sweat, saliva, sputum, nasal mucus, vomit, urine, or feces, on them. Wear gloves when handling laundry from the patient. Read and follow directions on labels of laundry or clothing items and detergent. In general, wash and dry with the warmest temperatures recommended on the label.  Clean all areas the individual has used often Clean all touchable surfaces, such as counters, tabletops, doorknobs, bathroom fixtures, toilets, phones, keyboards, tablets, and bedside tables, every day. Also, clean any surfaces that may have blood, body fluids, and/or secretions or excretions on them. Wear gloves when cleaning surfaces the patient has come in contact with. Use a diluted bleach solution (e.g., dilute bleach with 1 part bleach and 10 parts water) or a household disinfectant with a label that says EPA-registered for coronaviruses. To make a bleach solution  at home, add 1 tablespoon of bleach to 1 quart (4 cups) of water. For a larger supply, add  cup of bleach to 1 gallon (16 cups) of water. Read labels of cleaning products and follow recommendations provided on product labels. Labels contain instructions for safe and effective use of the cleaning product including precautions you should take when applying the product, such as wearing gloves or eye protection and making sure you have good ventilation  during use of the product. Remove gloves and wash hands immediately after cleaning.  Monitor yourself for signs and symptoms of illness Caregivers and household members are considered close contacts, should monitor their health, and will be asked to limit movement outside of the home to the extent possible. Follow the monitoring steps for close contacts listed on the symptom monitoring form.   ? If you have additional questions, contact your local health department or call the epidemiologist on call at [phone] (available 24/7). ? This guidance is subject to change. For the most up-to-date guidance from [facility], please refer to their website: [email or web address]

## 2019-12-19 ENCOUNTER — Inpatient Hospital Stay (HOSPITAL_COMMUNITY)
Admission: AD | Admit: 2019-12-19 | Discharge: 2019-12-19 | Disposition: A | Discharge status: Home / Self Care | Payer: Medicaid Other | Attending: Obstetrics and Gynecology | Admitting: Obstetrics and Gynecology

## 2019-12-19 ENCOUNTER — Inpatient Hospital Stay (HOSPITAL_COMMUNITY): Payer: Medicaid Other

## 2019-12-19 ENCOUNTER — Other Ambulatory Visit: Payer: Self-pay

## 2019-12-19 ENCOUNTER — Encounter (HOSPITAL_COMMUNITY): Payer: Self-pay | Admitting: Obstetrics and Gynecology

## 2019-12-19 DIAGNOSIS — O00102 Left tubal pregnancy without intrauterine pregnancy: Secondary | ICD-10-CM | POA: Diagnosis not present

## 2019-12-19 DIAGNOSIS — F17200 Nicotine dependence, unspecified, uncomplicated: Secondary | ICD-10-CM | POA: Diagnosis not present

## 2019-12-19 DIAGNOSIS — O99331 Smoking (tobacco) complicating pregnancy, first trimester: Secondary | ICD-10-CM | POA: Insufficient documentation

## 2019-12-19 DIAGNOSIS — Z3A01 Less than 8 weeks gestation of pregnancy: Secondary | ICD-10-CM | POA: Insufficient documentation

## 2019-12-19 DIAGNOSIS — Z6790 Unspecified blood type, Rh positive: Secondary | ICD-10-CM

## 2019-12-19 DIAGNOSIS — O4690 Antepartum hemorrhage, unspecified, unspecified trimester: Secondary | ICD-10-CM

## 2019-12-19 DIAGNOSIS — O209 Hemorrhage in early pregnancy, unspecified: Secondary | ICD-10-CM | POA: Diagnosis present

## 2019-12-19 LAB — URINALYSIS, ROUTINE W REFLEX MICROSCOPIC
Bacteria, UA: NONE SEEN
Bilirubin Urine: NEGATIVE
Glucose, UA: NEGATIVE mg/dL
Ketones, ur: NEGATIVE mg/dL
Leukocytes,Ua: NEGATIVE
Nitrite: NEGATIVE
Protein, ur: NEGATIVE mg/dL
Specific Gravity, Urine: 1.011 (ref 1.005–1.030)
pH: 7 (ref 5.0–8.0)

## 2019-12-19 LAB — COMPREHENSIVE METABOLIC PANEL
ALT: 17 U/L (ref 0–44)
AST: 13 U/L — ABNORMAL LOW (ref 15–41)
Albumin: 3.7 g/dL (ref 3.5–5.0)
Alkaline Phosphatase: 81 U/L (ref 38–126)
Anion gap: 12 (ref 5–15)
BUN: 7 mg/dL (ref 6–20)
CO2: 21 mmol/L — ABNORMAL LOW (ref 22–32)
Calcium: 8.8 mg/dL — ABNORMAL LOW (ref 8.9–10.3)
Chloride: 106 mmol/L (ref 98–111)
Creatinine, Ser: 0.66 mg/dL (ref 0.44–1.00)
GFR, Estimated: >60 mL/min (ref >60)
Glucose, Bld: 85 mg/dL (ref 70–99)
Potassium: 4 mmol/L (ref 3.5–5.1)
Sodium: 139 mmol/L (ref 135–145)
Total Bilirubin: 0.5 mg/dL (ref 0.3–1.2)
Total Protein: 6.5 g/dL (ref 6.5–8.1)

## 2019-12-19 LAB — CBC
HCT: 46 % (ref 36.0–46.0)
Hemoglobin: 14.9 g/dL (ref 12.0–15.0)
MCH: 28.1 pg (ref 26.0–34.0)
MCHC: 32.4 g/dL (ref 30.0–36.0)
MCV: 86.8 fL (ref 80.0–100.0)
Platelets: 336 10*3/uL (ref 150–400)
RBC: 5.3 MIL/uL — ABNORMAL HIGH (ref 3.87–5.11)
RDW: 12.3 % (ref 11.5–15.5)
WBC: 9.2 10*3/uL (ref 4.0–10.5)
nRBC: 0 % (ref 0.0–0.2)

## 2019-12-19 LAB — HCG, QUANTITATIVE, PREGNANCY: hCG, Beta Chain, Quant, S: 2845 m[IU]/mL — ABNORMAL HIGH (ref <5)

## 2019-12-19 LAB — WET PREP, GENITAL
Sperm: NONE SEEN
Trich, Wet Prep: NONE SEEN
Yeast Wet Prep HPF POC: NONE SEEN

## 2019-12-19 LAB — POCT PREGNANCY, URINE: Preg Test, Ur: POSITIVE — AB

## 2019-12-19 MED ORDER — METHOTREXATE FOR ECTOPIC PREGNANCY
50.0000 mg/m2 | Freq: Once | INTRAMUSCULAR | Status: AC
  Administered 2019-12-19: 90 mg via INTRAMUSCULAR
  Filled 2019-12-19: qty 1

## 2019-12-19 NOTE — Discharge Instructions (Signed)
Methotrexate Treatment for an Ectopic Pregnancy, Care After This sheet gives you information about how to care for yourself after your procedure. Your health care provider may also give you more specific instructions. If you have problems or questions, contact your health care provider. What can I expect after the procedure? After the procedure, it is common to have:  Abdominal cramping.  Vaginal bleeding.  Fatigue.  Nausea.  Vomiting.  Diarrhea. Blood tests will be taken at timed intervals for several days or weeks to check your pregnancy hormone levels. The blood tests will be done until the pregnancy hormone can no longer be detected in the blood. Follow these instructions at home: Activity  Do not have sex until your health care provider approves.  Limit activities that take a lot of effort as told by your health care provider. Medicines  Take over the counter and prescription medicines only as told by your health care provider.  Do not take aspirin, ibuprofen, naproxen, or any other NSAIDs.  Do not take folic acid, prenatal vitamins, or other vitamins that contain folic acid. General instructions   Do not drink alcohol.  Follow instructions from your health care provider on how and when to report any symptoms that may indicate a ruptured ectopic pregnancy.  Keep all follow-up visits as told by your health care provider. This is important. Contact a health care provider if:  You have persistent nausea and vomiting.  You have persistent diarrhea.  You are having a reaction to the medicine, such as: ? Tiredness. ? Skin rash. ? Hair loss. Get help right away if:  Your abdominal or pelvic pain gets worse.  You have more vaginal bleeding.  You feel light-headed or you faint.  You have shortness of breath.  Your heart rate increases.  You develop a cough.  You have chills.  You have a fever. Summary  After the procedure, it is common to have symptoms  of abdominal cramping, vaginal bleeding and fatigue. You may also experience other symptoms.  Blood tests will be taken at timed intervals for several days or weeks to check your pregnancy hormone levels. The blood tests will be done until the pregnancy hormone can no longer be detected in the blood.  Limit strenuous activity as told by your health care provider.  Follow instructions from your health care provider on how and when to report any symptoms that may indicate a ruptured ectopic pregnancy. This information is not intended to replace advice given to you by your health care provider. Make sure you discuss any questions you have with your health care provider. Document Revised: 01/13/2017 Document Reviewed: 03/22/2016 Elsevier Patient Education  2020 [employer] Inc[employer]  

## 2019-12-19 NOTE — MAU Provider Note (Signed)
History     CSN: [id number]  Arrival date and time: 12/19/19 1042   First Provider Initiated Contact with Patient 12/19/19 1204      Chief Complaint  Patient presents with  . Vaginal Bleeding   34 y.o. [id number] @[redacted]w[redacted]d  by sure LMP presenting with vaginal spotting and abdominal cramping. Reports onset of sx yesterday. Spotting has been pink and brown when she wipes. Also saw some in the toilet. Has not needed a pad. Cramping is central in lower abdomen. Rates pain 6/10. Has taken Tylenol with little relief. Denies vaginal discharge prior. No GI sx except nausea.   OB History    Gravida  8   Para  3   Term  3   Preterm  0   AB  4   Living  3     SAB  2   TAB  2   Ectopic  0   Multiple  0   Live Births  1           Past Medical History:  Diagnosis Date  . Hypertension   . No pertinent past medical history     Past Surgical History:  Procedure Laterality Date  . BREAST SURGERY     Augmentation 10/2013  . THERAPEUTIC ABORTION     May 2016  . TONSILLECTOMY      Family History  Problem Relation Age of Onset  . Hypertension Mother   . Diabetes Mother     Social History   Tobacco Use  . Smoking status: Current Every Day Smoker  . Smokeless tobacco: Never Used  Substance Use Topics  . Alcohol use: No    Comment: socially  . Drug use: Yes    Types: Marijuana    Comment: every other day    Allergies:  Allergies  Allergen Reactions  . Hydrocodone-Acetaminophen Nausea Only    Made face feel flushed; felt hot     Medications Prior to Admission  Medication Sig Dispense Refill Last Dose  . albuterol (PROVENTIL HFA;VENTOLIN HFA) 108 (90 BASE) MCG/ACT inhaler Inhale 2 puffs into the lungs every 6 (six) hours as needed for wheezing or shortness of breath.      . benzonatate (TESSALON) 100 MG capsule Take 1 capsule (100 mg total) by mouth every 8 (eight) hours. 21 capsule 0   . etonogestrel (NEXPLANON) 68 MG IMPL implant 1 each by Subdermal route  once.     Marland Kitchen. HYDROcodone-acetaminophen (NORCO) 10-325 MG tablet Take 1 tablet by mouth every 6 (six) hours as needed. 20 tablet 0   . ibuprofen (ADVIL,MOTRIN) 800 MG tablet Take 1 tablet (800 mg total) by mouth every 8 (eight) hours as needed for mild pain, moderate pain or cramping. 40 tablet 1     Review of Systems  Constitutional: Negative for chills and fever.  Gastrointestinal: Positive for abdominal pain and nausea. Negative for constipation, diarrhea and vomiting.  Genitourinary: Positive for vaginal bleeding.   Physical Exam   Blood pressure 137/83, pulse 68, temperature 98.5 F (36.9 C), resp. rate 18, height 5\' 1"  (1.549 m), weight 71.7 kg, last menstrual period 11/23/2019, unknown if currently breastfeeding.  Physical Exam Vitals and nursing note reviewed. Exam conducted with a chaperone present.  Constitutional:      General: She is not in acute distress.    Appearance: Normal appearance.  HENT:     Head: Normocephalic and atraumatic.  Cardiovascular:     Rate and Rhythm: Normal rate.  Pulmonary:  Effort: Pulmonary effort is normal. No respiratory distress.  Abdominal:     General: There is no distension.     Palpations: Abdomen is soft. There is no mass.     Tenderness: There is no abdominal tenderness. There is no guarding or rebound.  Genitourinary:    Comments: External: no lesions or erythema Vagina: rugated, pink, moist, scant dark brown discharge Uterus: non enlarged, anteverted, non tender, no CMT Adnexae: no masses, no tenderness left, no tenderness right Cervix closed/long  Musculoskeletal:        General: Normal range of motion.     Cervical back: Normal range of motion.  Skin:    General: Skin is warm and dry.  Neurological:     General: No focal deficit present.     Mental Status: She is alert and oriented to person, place, and time.  Psychiatric:        Mood and Affect: Mood normal.        Behavior: Behavior normal.    Results for orders  placed or performed during the hospital encounter of 12/19/19 (from the past 24 hour(s))  Pregnancy, urine POC     Status: Abnormal   Collection Time: 12/19/19 11:11 AM  Result Value Ref Range   Preg Test, Ur POSITIVE (A) NEGATIVE  Urinalysis, Routine w reflex microscopic Urine, Clean Catch     Status: Abnormal   Collection Time: 12/19/19 11:19 AM  Result Value Ref Range   Color, Urine YELLOW YELLOW   APPearance CLEAR CLEAR   Specific Gravity, Urine 1.011 1.005 - 1.030   pH 7.0 5.0 - 8.0   Glucose, UA NEGATIVE NEGATIVE mg/dL   Hgb urine dipstick MODERATE (A) NEGATIVE   Bilirubin Urine NEGATIVE NEGATIVE   Ketones, ur NEGATIVE NEGATIVE mg/dL   Protein, ur NEGATIVE NEGATIVE mg/dL   Nitrite NEGATIVE NEGATIVE   Leukocytes,Ua NEGATIVE NEGATIVE   RBC / HPF 0-5 0 - 5 RBC/hpf   WBC, UA 0-5 0 - 5 WBC/hpf   Bacteria, UA NONE SEEN NONE SEEN   Squamous Epithelial / LPF 0-5 0 - 5  Wet prep, genital     Status: Abnormal   Collection Time: 12/19/19 12:14 PM  Result Value Ref Range   Yeast Wet Prep HPF POC NONE SEEN NONE SEEN   Trich, Wet Prep NONE SEEN NONE SEEN   Clue Cells Wet Prep HPF POC PRESENT (A) NONE SEEN   WBC, Wet Prep HPF POC MANY (A) NONE SEEN   Sperm NONE SEEN   CBC     Status: Abnormal   Collection Time: 12/19/19 12:20 PM  Result Value Ref Range   WBC 9.2 4.0 - 10.5 K/uL   RBC 5.30 (H) 3.87 - 5.11 MIL/uL   Hemoglobin 14.9 12.0 - 15.0 g/dL   HCT 29.9 36 - 46 %   MCV 86.8 80.0 - 100.0 fL   MCH 28.1 26.0 - 34.0 pg   MCHC 32.4 30.0 - 36.0 g/dL   RDW 24.2 68.3 - 41.9 %   Platelets 336 150 - 400 K/uL   nRBC 0.0 0.0 - 0.2 %  hCG, quantitative, pregnancy     Status: Abnormal   Collection Time: 12/19/19 12:20 PM  Result Value Ref Range   hCG, Beta Chain, Quant, S 2,845 (H) <5 mIU/mL  Comprehensive metabolic panel     Status: Abnormal   Collection Time: 12/19/19 12:20 PM  Result Value Ref Range   Sodium 139 135 - 145 mmol/L   Potassium 4.0 3.5 - 5.1  mmol/L   Chloride 106 98 -  111 mmol/L   CO2 21 (L) 22 - 32 mmol/L   Glucose, Bld 85 70 - 99 mg/dL   BUN 7 6 - 20 mg/dL   Creatinine, Ser 1.61 0.44 - 1.00 mg/dL   Calcium 8.8 (L) 8.9 - 10.3 mg/dL   Total Protein 6.5 6.5 - 8.1 g/dL   Albumin 3.7 3.5 - 5.0 g/dL   AST 13 (L) 15 - 41 U/L   ALT 17 0 - 44 U/L   Alkaline Phosphatase 81 38 - 126 U/L   Total Bilirubin 0.5 0.3 - 1.2 mg/dL   GFR, Estimated >09 >60 mL/min   Anion gap 12 5 - 15   US OB LESS THAN 14 WEEKS WITH OB TRANSVAGINAL  Result Date: 12/19/2019 CLINICAL DATA:  Vaginal bleeding in first trimester of pregnancy, LMP 11/23/2019, quantitative beta HCG 2,845 EXAM: OBSTETRIC <14 WK [place] AND TRANSVAGINAL OB US TECHNIQUE: Both transabdominal and transvaginal ultrasound examinations were performed for complete evaluation of the gestation as well as the maternal uterus, adnexal regions, and pelvic cul-de-sac. Transvaginal technique was performed to assess early pregnancy. COMPARISON:  None FINDINGS: Intrauterine gestational sac: Absent Yolk sac:  N/A Embryo:  N/A Cardiac Activity: N/A Heart Rate: N/A  bpm MSD:   mm    w     d CRL:    mm    w    d                  [place] EDC: Subchorionic hemorrhage:  N/A Maternal uterus/adnexae: Uterus retroverted, normal in appearance. No intrauterine mass, fluid, or gestational sac identified. RIGHT ovary measures 2.9 x 3.6 x 2.8 cm and contains a small hemorrhagic corpus luteum. LEFT ovary measures 2.1 x 2.8 x 2.4 cm and contains a resolving corpus luteum. Trace free pelvic fluid. LEFT adnexal mass identified, separate from LEFT ovary, 3.3 x 1.9 x 1.8 cm, suspicious for an ectopic pregnancy. IMPRESSION: No intrauterine gestation identified. Uterus and ovaries otherwise unremarkable. LEFT adnexal mass 3.3 cm in greatest size, concerning for a LEFT adnexal ectopic pregnancy. Trace free pelvic fluid. Critical Value/emergent results were called by telephone at the time of interpretation on 12/19/2019 at 2:26 pm to provider [facility] , who verbally  acknowledged these results. Electronically Signed   By: Ulyses Southward M.D.   On: 12/19/2019 14:27   MAU Course  Procedures MTX  MDM Labs and [place] ordered and reviewed. TC from Radiologist, suspect left ectopic. Consult with Dr. Jolayne Panther, recommends MTX. Dr. Mindi Slicker notified of presentation, clinical findings, imaging results, and recommendation for MTX, MD agrees with plan.    The risks of methotrexate were reviewed including failure requiring repeat dosing or eventual surgery. She understands that methotrexate involves frequent return visits to monitor lab values and that she remains at risk of ectopic rupture until her beta is less than assay. The patient opts to proceed with methotrexate. She has no history of hepatic or renal dysfunction, has  normal BUN/Cr/LFT's/platelets. She is felt to be reliable for follow-up. Side effects of photosensitivity & GI upset were discussed. She knows to avoid direct sunlight and abstain from alcohol, aspirin and aspirin-like products for two weeks. She was counseled to discontinue any MVI with folic acid. She understands to follow up on D4 (12/22/19) and D7 (12/25/19) for repeat BHCG and was given the instruction sheet. Strict ectopic precautions were reviewed, the patient knows to call with any abdominal pain, vomiting, fainting, or any concerns with  her health. Rh pos. Stable for discharge home.   Assessment and Plan   1. Left tubal pregnancy without intrauterine pregnancy   2. Vaginal bleeding in pregnancy   3. Blood type, Rh positive    Discharge home Follow up on 11/7 in MAU for day 4 qhcg Strict return precautions  Allergies as of 12/19/2019      Reactions   Hydrocodone-acetaminophen Nausea Only   Made face feel flushed; felt hot       Medication List    STOP taking these medications   benzonatate 100 MG capsule Commonly known as: TESSALON   etonogestrel 68 MG Impl implant Commonly known as: NEXPLANON   HYDROcodone-acetaminophen 10-325 MG  tablet Commonly known as: NORCO   ibuprofen 800 MG tablet Commonly known as: ADVIL     TAKE these medications   albuterol 108 (90 Base) MCG/ACT inhaler Commonly known as: VENTOLIN HFA Inhale 2 puffs into the lungs every 6 (six) hours as needed for wheezing or shortness of breath.      Donette Larry, CNM 12/19/2019, 3:53 PM

## 2019-12-19 NOTE — MAU Note (Signed)
Pt as positive HPT a few weeks ago. Started having some vag bleeding and cramping yesterday that continues today.

## 2019-12-20 LAB — GC/CHLAMYDIA PROBE AMP (~~LOC~~) NOT AT ARMC
Chlamydia: NEGATIVE
Comment: NEGATIVE
Comment: NORMAL
Neisseria Gonorrhea: NEGATIVE

## 2019-12-23 ENCOUNTER — Other Ambulatory Visit: Payer: Self-pay

## 2019-12-23 ENCOUNTER — Inpatient Hospital Stay (HOSPITAL_COMMUNITY): Payer: Medicaid Other

## 2019-12-23 ENCOUNTER — Encounter (HOSPITAL_COMMUNITY): Payer: Self-pay | Admitting: Obstetrics and Gynecology

## 2019-12-23 ENCOUNTER — Inpatient Hospital Stay (EMERGENCY_DEPARTMENT_HOSPITAL)
Admission: AD | Admit: 2019-12-23 | Discharge: 2019-12-23 | Disposition: A | Discharge status: Home / Self Care | Payer: Medicaid Other | Source: Home / Self Care | Attending: Obstetrics and Gynecology | Admitting: Obstetrics and Gynecology

## 2019-12-23 ENCOUNTER — Inpatient Hospital Stay (HOSPITAL_COMMUNITY)
Admission: AD | Admit: 2019-12-23 | Discharge: 2019-12-23 | Disposition: A | Discharge status: Home / Self Care | Payer: Medicaid Other | Attending: Obstetrics and Gynecology | Admitting: Obstetrics and Gynecology

## 2019-12-23 DIAGNOSIS — O00102 Left tubal pregnancy without intrauterine pregnancy: Secondary | ICD-10-CM

## 2019-12-23 DIAGNOSIS — O99321 Drug use complicating pregnancy, first trimester: Secondary | ICD-10-CM | POA: Insufficient documentation

## 2019-12-23 DIAGNOSIS — F1290 Cannabis use, unspecified, uncomplicated: Secondary | ICD-10-CM | POA: Insufficient documentation

## 2019-12-23 DIAGNOSIS — O99330 Smoking (tobacco) complicating pregnancy, unspecified trimester: Secondary | ICD-10-CM | POA: Insufficient documentation

## 2019-12-23 DIAGNOSIS — Z3A01 Less than 8 weeks gestation of pregnancy: Secondary | ICD-10-CM | POA: Insufficient documentation

## 2019-12-23 DIAGNOSIS — Z885 Allergy status to narcotic agent status: Secondary | ICD-10-CM | POA: Diagnosis not present

## 2019-12-23 DIAGNOSIS — O00119 Unspecified tubal pregnancy with intrauterine pregnancy: Secondary | ICD-10-CM

## 2019-12-23 LAB — CBC
HCT: 40.2 % (ref 36.0–46.0)
Hemoglobin: 12.9 g/dL (ref 12.0–15.0)
MCH: 28.2 pg (ref 26.0–34.0)
MCHC: 32.1 g/dL (ref 30.0–36.0)
MCV: 87.8 fL (ref 80.0–100.0)
Platelets: 296 10*3/uL (ref 150–400)
RBC: 4.58 MIL/uL (ref 3.87–5.11)
RDW: 12.2 % (ref 11.5–15.5)
WBC: 10.4 10*3/uL (ref 4.0–10.5)
nRBC: 0 % (ref 0.0–0.2)

## 2019-12-23 LAB — HCG, QUANTITATIVE, PREGNANCY: hCG, Beta Chain, Quant, S: 3794 m[IU]/mL — ABNORMAL HIGH (ref <5)

## 2019-12-23 NOTE — MAU Note (Signed)
.   Deborah Brock is a 34 y.o. at [redacted]w[redacted]d here in MAU reporting: lower abdominal pain predominately on the left side. She does have an ectopic pregnancy and has had mtx x1 on 12/19/19. Has not had pain until 2100 tonight. States her hcg level rose this morning and she was told to come back with strict ectopic precautions. Is supposed to have an appointment on Thursday.   Pain score: 7 Vitals:   12/23/19 2201  BP: 114/65  Pulse: 83  Resp: 16  Temp: 98.6 F (37 C)  SpO2: 100%

## 2019-12-23 NOTE — MAU Provider Note (Signed)
First Provider Initiated Contact with Patient 12/23/19 1020      S Deborah Brock is a 34 y.o. [id number] patient who presents to MAU today with complaint of Day 4 labs after methotrexate for ectopic pregnancy. She was supposed to come yesterday.   O BP (!) 115/59   Pulse 76   Temp 98.6 F (37 C)   Resp 19   LMP 11/23/2019  Physical Exam Vitals reviewed.  Constitutional:      Appearance: Normal appearance.  Cardiovascular:     Rate and Rhythm: Normal rate.  Neurological:     Mental Status: She is alert.  Psychiatric:        Mood and Affect: Mood normal.        Behavior: Behavior normal.        Thought Content: Thought content normal.        Judgment: Judgment normal.     A Medical screening exam complete   ICD-10-CM   1. Tubal pregnancy with intrauterine pregnancy, unspecified laterality  O00.119      P Discharge from MAU in stable condition Will call patient with results. Strict ectopic precautions given Patient may return to MAU as needed   Levie Heritage, DO 12/23/2019 10:22 AM

## 2019-12-23 NOTE — MAU Provider Note (Signed)
History     CSN: [id number]  Arrival date and time: 12/23/19 2155   First Provider Initiated Contact with Patient 12/23/19 2251      Chief Complaint  Patient presents with  . Abdominal Pain  . Ectopic Pregnancy   Deborah Brock is a 34 y.o. G8P3 patient who presents to MAU with complaints of abdominal pain. Patient has a known ectopic pregnancy and was seen in MAU earlier this afternoon for day 4 labs after MTX. Patient reports around 2100 started having sharp abdominal pain in her lower abdomen that radiated to her lower lower quadrant. Patient reports that she was not having any pain since diagnoses of ectopic pregnancy until today. Rates pain 7/10 - denies taking any medication specifically for pain, patient reports that she took Nyquil prior to abdominal pain starting. Patient reports upon arrival to MAU that pain resolved. Currently denies any pain. Reports pain occurred from 2100-2230.    OB History    Gravida  8   Para  3   Term  3   Preterm  0   AB  4   Living  3     SAB  2   TAB  2   Ectopic  0   Multiple  0   Live Births  1           Past Medical History:  Diagnosis Date  . Hypertension   . No pertinent past medical history     Past Surgical History:  Procedure Laterality Date  . BREAST SURGERY     Augmentation 10/2013  . THERAPEUTIC ABORTION     May 2016  . TONSILLECTOMY      Family History  Problem Relation Age of Onset  . Hypertension Mother   . Diabetes Mother     Social History   Tobacco Use  . Smoking status: Current Every Day Smoker  . Smokeless tobacco: Never Used  Substance Use Topics  . Alcohol use: No    Comment: socially  . Drug use: Yes    Types: Marijuana    Comment: every other day    Allergies:  Allergies  Allergen Reactions  . Hydrocodone-Acetaminophen Nausea Only    Made face feel flushed; felt hot     Medications Prior to Admission  Medication Sig Dispense Refill Last Dose  . albuterol (PROVENTIL  HFA;VENTOLIN HFA) 108 (90 BASE) MCG/ACT inhaler Inhale 2 puffs into the lungs every 6 (six) hours as needed for wheezing or shortness of breath.    12/23/2019 at Unknown time    Review of Systems  Constitutional: Negative.   Respiratory: Negative.   Cardiovascular: Negative.   Gastrointestinal: Positive for abdominal pain. Negative for constipation, diarrhea, nausea and vomiting.  Musculoskeletal: Negative.   Neurological: Negative.   Psychiatric/Behavioral: Negative.    Physical Exam   Blood pressure 114/65, pulse 83, temperature 98.6 F (37 C), temperature source Oral, resp. rate 16, last menstrual period 11/23/2019, SpO2 100 %, unknown if currently breastfeeding.  Physical Exam Vitals reviewed.  Constitutional:      Appearance: Normal appearance.  HENT:     Head: Normocephalic.  Cardiovascular:     Rate and Rhythm: Normal rate and regular rhythm.  Pulmonary:     Effort: Pulmonary effort is normal. No respiratory distress.     Breath sounds: Normal breath sounds. No wheezing.  Abdominal:     General: There is no distension.     Palpations: Abdomen is soft.     Tenderness: There is no  abdominal tenderness. There is no guarding or rebound.  Skin:    General: Skin is warm and dry.  Neurological:     Mental Status: She is alert and oriented to person, place, and time.  Psychiatric:        Mood and Affect: Mood normal.        Behavior: Behavior normal.        Thought Content: Thought content normal.     MAU Course  Procedures  MDM Repeat US ordered tonight - d/t new onset of abdominal pain  Consult with Dr Vergie Living prior to results of [place]- recommends CBC and type and screen   HCG this afternoon was 3,794 - HCG on 11/4 was 2,845  US OB Transvaginal  Addendum Date: 12/23/2019   ADDENDUM REPORT: 12/23/2019 23:30 ADDENDUM: Critical Value/emergent results were called by telephone on 12/23/2019 at 11:30 pm to provider [facility] , who verbally acknowledged these results.  Electronically Signed   By: Kreg Shropshire M.D.   On: 12/23/2019 23:30   Result Date: 12/23/2019 CLINICAL DATA:  Left tubal ectopic post methotrexate 12/19/2019 EXAM: TRANSVAGINAL OB ULTRASOUND TECHNIQUE: Transvaginal ultrasound was performed for complete evaluation of the gestation as well as the maternal uterus, adnexal regions, and pelvic cul-de-sac. COMPARISON:  Ultrasound 12/19/2019 FINDINGS: Intrauterine gestational sac: None Yolk sac:  Not Visualized. Embryo:  Not Visualized. Cardiac Activity: Not Visualized. Maternal uterus/adnexae: Retroverted maternal uterus. Otherwise unremarkable. Redemonstration of a heterogeneously echogenic solid and cystic mass in the left adnexa measuring 2.2 x 1.7 x 1.6 cm in size, adjacent the left ovary compatible with previously seen ectopic. Slightly decreased in size from prior where this measured 3.3 x 1.9 x 1.8 cm in size. Trace anechoic free fluid is seen in the left adnexa the without large volume hemorrhage. No other acute or worrisome adnexal abnormalities. IMPRESSION: Redemonstration of the heterogeneous mass in the left adnexa which remains consistent with a left tubal ectopic slightly diminished in size from comparison study. Trace anechoic free fluid is nonspecific. No discernible hemorrhage. Electronically Signed: By: Kreg Shropshire M.D. On: 12/23/2019 23:02   Discussed results of [place] with Dr Vergie Living and patient - pain is resolved and ectopic pregnancy is smaller than on 11/4 Dr Vergie Living at bedside to assess patient - okay with discharge home. Discussed with patient to continue Day 7 labs in the office as scheduled and return to MAU as needed for strict ectopic precautions.   Patient stable at time of discharge.   Assessment and Plan   1. Left tubal pregnancy without intrauterine pregnancy    Discharge home Follow up as scheduled in the office for Day 7 labs  Return to MAU as needed for reasons discussed and/or emergencies  Strict ectopic precautions     Follow-up Information    [facility] Go on 12/26/2019.   Why: Follow up as scheduled on thursday for repeat lab work  Contact information: [address]  [address] [place] [address] [phone]              Allergies as of 12/23/2019      Reactions   Hydrocodone-acetaminophen Nausea Only   Made face feel flushed; felt hot       Medication List    TAKE these medications   albuterol 108 (90 Base) MCG/ACT inhaler Commonly known as: VENTOLIN HFA Inhale 2 puffs into the lungs every 6 (six) hours as needed for wheezing or shortness of breath.        Suzette Battiest  Eliezer Lofts CNM 12/23/2019, 11:04 PM

## 2019-12-23 NOTE — Discharge Instructions (Signed)
Ectopic Pregnancy  An ectopic pregnancy happens when a fertilized egg grows outside the womb (uterus). The fertilized egg cannot stay alive outside of the womb. This problem often happens in a fallopian tube. It is often caused by damage to the tube. If this problem is found early, you may be treated with medicine that stops the egg from growing. If your tube tears or bursts open (ruptures), you will bleed inside. Often, there is very bad pain in the lower belly. This is an emergency. You will need surgery. Get help right away. Follow these instructions at home: After being treated with medicine or surgery:  Rest and limit your activity for as long as told by your doctor.  Until your doctor says that it is safe: ? Do not lift anything that is heavier than 10 lb (4.5 kg) or the limit that your doctor tells you. ? Avoid exercise and any movement that takes a lot of effort.  To prevent problems when pooping (constipation): ? Eat a healthy diet. This includes:  Fruits.  Vegetables.  Whole grains. ? Drink 6-8 glasses of water a day. Contact a doctor if: Get help right away if:  You have sudden and very bad pain in your belly.  You have very bad pain in your shoulders or neck.  You have pain that gets worse and is not helped by medicine.  You have: ? A fever or chills. ? Vaginal bleeding. ? Redness or swelling at the site of a surgical cut (incision).  You feel sick to your stomach (nauseous) or you throw up (vomit).  You feel dizzy or weak.  You feel light-headed or you pass out (faint). Summary  An ectopic pregnancy happens when a fertilized egg grows outside the womb (uterus).  If this problem is found early, you may be treated with medicine that stops the egg from growing.  If your tube tears or bursts open (ruptures), you will need surgery. This is an emergency. Get help right away. This information is not intended to replace advice given to you by your health care  provider. Make sure you discuss any questions you have with your health care provider. Document Revised: 01/13/2017 Document Reviewed: 02/25/2016 Elsevier Patient Education  2020 [employer]   Methotrexate Treatment for an Ectopic Pregnancy, Care After This sheet gives you information about how to care for yourself after your procedure. Your health care provider may also give you more specific instructions. If you have problems or questions, contact your health care provider. What can I expect after the procedure? After the procedure, it is common to have:  Abdominal cramping.  Vaginal bleeding.  Fatigue.  Nausea.  Vomiting.  Diarrhea. Blood tests will be taken at timed intervals for several days or weeks to check your pregnancy hormone levels. The blood tests will be done until the pregnancy hormone can no longer be detected in the blood. Follow these instructions at home: Activity  Do not have sex until your health care provider approves.  Limit activities that take a lot of effort as told by your health care provider. Medicines  Take over the counter and prescription medicines only as told by your health care provider.  Do not take aspirin, ibuprofen, naproxen, or any other NSAIDs.  Do not take folic acid, prenatal vitamins, or other vitamins that contain folic acid. General instructions   Do not drink alcohol.  Follow instructions from your health care provider on how and when to report any symptoms that may indicate   a ruptured ectopic pregnancy.  Keep all follow-up visits as told by your health care provider. This is important. Contact a health care provider if:  You have persistent nausea and vomiting.  You have persistent diarrhea.  You are having a reaction to the medicine, such as: ? Tiredness. ? Skin rash. ? Hair loss. Get help right away if:  Your abdominal or pelvic pain gets worse.  You have more vaginal bleeding.  You feel light-headed or  you faint.  You have shortness of breath.  Your heart rate increases.  You develop a cough.  You have chills.  You have a fever. Summary  After the procedure, it is common to have symptoms of abdominal cramping, vaginal bleeding and fatigue. You may also experience other symptoms.  Blood tests will be taken at timed intervals for several days or weeks to check your pregnancy hormone levels. The blood tests will be done until the pregnancy hormone can no longer be detected in the blood.  Limit strenuous activity as told by your health care provider.  Follow instructions from your health care provider on how and when to report any symptoms that may indicate a ruptured ectopic pregnancy. This information is not intended to replace advice given to you by your health care provider. Make sure you discuss any questions you have with your health care provider. Document Revised: 01/13/2017 Document Reviewed: 03/22/2016 Elsevier Patient Education  2020 [employer].

## 2019-12-23 NOTE — Progress Notes (Signed)
Notified of results. Notified Dr Mindi Slicker - her office will schedule Day 7 follow up.

## 2019-12-23 NOTE — MAU Note (Signed)
Patient here for f/u quant after receiving methotrexate injection.  Reports she had some vaginal bleeding and cramping the day after receiving the injection but none now.  No other complaints.

## 2019-12-24 LAB — TYPE AND SCREEN
ABO/RH(D): O POS
Antibody Screen: NEGATIVE

## 2020-01-07 ENCOUNTER — Encounter (HOSPITAL_COMMUNITY): Payer: Self-pay | Admitting: Obstetrics and Gynecology

## 2020-01-07 ENCOUNTER — Encounter (HOSPITAL_COMMUNITY)
Admission: AD | Disposition: A | Discharge status: Home / Self Care | Payer: Self-pay | Source: Home / Self Care | Attending: Obstetrics and Gynecology

## 2020-01-07 ENCOUNTER — Other Ambulatory Visit: Payer: Self-pay

## 2020-01-07 ENCOUNTER — Inpatient Hospital Stay (HOSPITAL_COMMUNITY): Payer: Medicaid Other | Admitting: Certified Registered"

## 2020-01-07 ENCOUNTER — Inpatient Hospital Stay (HOSPITAL_COMMUNITY)
Admission: AD | Admit: 2020-01-07 | Discharge: 2020-01-07 | Disposition: A | Discharge status: Home / Self Care | Payer: Medicaid Other | Attending: Obstetrics and Gynecology | Admitting: Obstetrics and Gynecology

## 2020-01-07 ENCOUNTER — Inpatient Hospital Stay (HOSPITAL_COMMUNITY): Payer: Medicaid Other

## 2020-01-07 DIAGNOSIS — O99331 Smoking (tobacco) complicating pregnancy, first trimester: Secondary | ICD-10-CM | POA: Insufficient documentation

## 2020-01-07 DIAGNOSIS — Z3A01 Less than 8 weeks gestation of pregnancy: Secondary | ICD-10-CM | POA: Diagnosis not present

## 2020-01-07 DIAGNOSIS — Z885 Allergy status to narcotic agent status: Secondary | ICD-10-CM | POA: Diagnosis not present

## 2020-01-07 DIAGNOSIS — O00101 Right tubal pregnancy without intrauterine pregnancy: Secondary | ICD-10-CM | POA: Diagnosis not present

## 2020-01-07 DIAGNOSIS — O411010 Infection of amniotic sac and membranes, unspecified, first trimester, not applicable or unspecified: Secondary | ICD-10-CM | POA: Diagnosis not present

## 2020-01-07 DIAGNOSIS — F17200 Nicotine dependence, unspecified, uncomplicated: Secondary | ICD-10-CM | POA: Insufficient documentation

## 2020-01-07 DIAGNOSIS — Z20822 Contact with and (suspected) exposure to covid-19: Secondary | ICD-10-CM | POA: Diagnosis not present

## 2020-01-07 DIAGNOSIS — O209 Hemorrhage in early pregnancy, unspecified: Secondary | ICD-10-CM | POA: Diagnosis present

## 2020-01-07 HISTORY — PX: DIAGNOSTIC LAPAROSCOPY WITH REMOVAL OF ECTOPIC PREGNANCY: SHX6449

## 2020-01-07 LAB — COMPREHENSIVE METABOLIC PANEL
ALT: 12 U/L (ref 0–44)
AST: 12 U/L — ABNORMAL LOW (ref 15–41)
Albumin: 3.6 g/dL (ref 3.5–5.0)
Alkaline Phosphatase: 78 U/L (ref 38–126)
Anion gap: 10 (ref 5–15)
BUN: 7 mg/dL (ref 6–20)
CO2: 22 mmol/L (ref 22–32)
Calcium: 8.6 mg/dL — ABNORMAL LOW (ref 8.9–10.3)
Chloride: 106 mmol/L (ref 98–111)
Creatinine, Ser: 0.77 mg/dL (ref 0.44–1.00)
GFR, Estimated: >60 mL/min (ref >60)
Glucose, Bld: 95 mg/dL (ref 70–99)
Potassium: 3.8 mmol/L (ref 3.5–5.1)
Sodium: 138 mmol/L (ref 135–145)
Total Bilirubin: 0.8 mg/dL (ref 0.3–1.2)
Total Protein: 6.2 g/dL — ABNORMAL LOW (ref 6.5–8.1)

## 2020-01-07 LAB — CBC
HCT: 41.1 % (ref 36.0–46.0)
Hemoglobin: 13.4 g/dL (ref 12.0–15.0)
MCH: 28.5 pg (ref 26.0–34.0)
MCHC: 32.6 g/dL (ref 30.0–36.0)
MCV: 87.4 fL (ref 80.0–100.0)
Platelets: 254 10*3/uL (ref 150–400)
RBC: 4.7 MIL/uL (ref 3.87–5.11)
RDW: 12.8 % (ref 11.5–15.5)
WBC: 8 10*3/uL (ref 4.0–10.5)
nRBC: 0 % (ref 0.0–0.2)

## 2020-01-07 LAB — HCG, QUANTITATIVE, PREGNANCY: hCG, Beta Chain, Quant, S: 802 m[IU]/mL — ABNORMAL HIGH (ref <5)

## 2020-01-07 LAB — RESPIRATORY PANEL BY RT PCR (FLU A&B, COVID)
Influenza A by PCR: NEGATIVE
Influenza B by PCR: NEGATIVE
SARS Coronavirus 2 by RT PCR: NEGATIVE

## 2020-01-07 LAB — TYPE AND SCREEN
ABO/RH(D): O POS
Antibody Screen: NEGATIVE

## 2020-01-07 SURGERY — LAPAROSCOPY, WITH ECTOPIC PREGNANCY SURGICAL TREATMENT
Anesthesia: General | Laterality: Right

## 2020-01-07 MED ORDER — ROCURONIUM BROMIDE 10 MG/ML (PF) SYRINGE
PREFILLED_SYRINGE | INTRAVENOUS | Status: AC
  Filled 2020-01-07: qty 10

## 2020-01-07 MED ORDER — ACETAMINOPHEN 325 MG PO TABS: 650.0000 mg | ORAL_TABLET | ORAL | 1 refills | Status: DC | PRN

## 2020-01-07 MED ORDER — BUPIVACAINE HCL (PF) 0.25 % IJ SOLN
INTRAMUSCULAR | Status: AC
  Filled 2020-01-07: qty 30

## 2020-01-07 MED ORDER — LIDOCAINE HCL (PF) 2 % IJ SOLN
INTRAMUSCULAR | Status: AC
  Filled 2020-01-07: qty 5

## 2020-01-07 MED ORDER — MIDAZOLAM HCL 2 MG/2ML IJ SOLN
INTRAMUSCULAR | Status: DC | PRN
  Administered 2020-01-07: 2 mg via INTRAVENOUS

## 2020-01-07 MED ORDER — FENTANYL CITRATE (PF) 250 MCG/5ML IJ SOLN
INTRAMUSCULAR | Status: AC
  Filled 2020-01-07: qty 5

## 2020-01-07 MED ORDER — FENTANYL CITRATE (PF) 100 MCG/2ML IJ SOLN
INTRAMUSCULAR | Status: AC
  Filled 2020-01-07: qty 2

## 2020-01-07 MED ORDER — SODIUM CHLORIDE 0.9 % IR SOLN
Status: DC | PRN
  Administered 2020-01-07: 3000 mL

## 2020-01-07 MED ORDER — FENTANYL CITRATE (PF) 100 MCG/2ML IJ SOLN
25.0000 ug | INTRAMUSCULAR | Status: DC | PRN
  Administered 2020-01-07: 25 ug via INTRAVENOUS

## 2020-01-07 MED ORDER — PROMETHAZINE HCL 25 MG/ML IJ SOLN: 6.2500 mg | INTRAMUSCULAR | Status: DC | PRN

## 2020-01-07 MED ORDER — ACETAMINOPHEN 500 MG PO TABS
1000.0000 mg | ORAL_TABLET | Freq: Once | ORAL | Status: AC
  Administered 2020-01-07: 1000 mg via ORAL
  Filled 2020-01-07: qty 2

## 2020-01-07 MED ORDER — MIDAZOLAM HCL 2 MG/2ML IJ SOLN
INTRAMUSCULAR | Status: AC
  Filled 2020-01-07: qty 2

## 2020-01-07 MED ORDER — SCOPOLAMINE 1 MG/3DAYS TD PT72
1.0000 | MEDICATED_PATCH | TRANSDERMAL | Status: DC
  Administered 2020-01-07: 1.5 mg via TRANSDERMAL
  Filled 2020-01-07: qty 1

## 2020-01-07 MED ORDER — SUCCINYLCHOLINE CHLORIDE 200 MG/10ML IV SOSY
PREFILLED_SYRINGE | INTRAVENOUS | Status: AC
  Filled 2020-01-07: qty 10

## 2020-01-07 MED ORDER — GABAPENTIN 300 MG PO CAPS: 300.0000 mg | ORAL_CAPSULE | ORAL | Status: DC

## 2020-01-07 MED ORDER — OXYCODONE-ACETAMINOPHEN 5-325 MG PO TABS: 1.0000 | ORAL_TABLET | ORAL | 0 refills | Status: AC | PRN

## 2020-01-07 MED ORDER — ONDANSETRON HCL 4 MG/2ML IJ SOLN
INTRAMUSCULAR | Status: AC
  Filled 2020-01-07: qty 2

## 2020-01-07 MED ORDER — DEXAMETHASONE SODIUM PHOSPHATE 10 MG/ML IJ SOLN
INTRAMUSCULAR | Status: AC
  Filled 2020-01-07: qty 1

## 2020-01-07 MED ORDER — ROCURONIUM BROMIDE 10 MG/ML (PF) SYRINGE
PREFILLED_SYRINGE | INTRAVENOUS | Status: DC | PRN
  Administered 2020-01-07: 20 mg via INTRAVENOUS

## 2020-01-07 MED ORDER — PROPOFOL 10 MG/ML IV BOLUS
INTRAVENOUS | Status: AC
  Filled 2020-01-07: qty 20

## 2020-01-07 MED ORDER — CELECOXIB 200 MG PO CAPS
200.0000 mg | ORAL_CAPSULE | Freq: Once | ORAL | Status: DC
  Filled 2020-01-07: qty 1

## 2020-01-07 MED ORDER — FENTANYL CITRATE (PF) 250 MCG/5ML IJ SOLN
INTRAMUSCULAR | Status: DC | PRN
  Administered 2020-01-07: 100 ug via INTRAVENOUS
  Administered 2020-01-07: 150 ug via INTRAVENOUS

## 2020-01-07 MED ORDER — PROPOFOL 10 MG/ML IV BOLUS
INTRAVENOUS | Status: DC | PRN
  Administered 2020-01-07: 200 mg via INTRAVENOUS

## 2020-01-07 MED ORDER — BUPIVACAINE HCL (PF) 0.25 % IJ SOLN
INTRAMUSCULAR | Status: DC | PRN
  Administered 2020-01-07: 20 mL

## 2020-01-07 MED ORDER — SUGAMMADEX SODIUM 200 MG/2ML IV SOLN
INTRAVENOUS | Status: DC | PRN
  Administered 2020-01-07: 200 mg via INTRAVENOUS

## 2020-01-07 MED ORDER — ONDANSETRON HCL 4 MG/2ML IJ SOLN
INTRAMUSCULAR | Status: DC | PRN
  Administered 2020-01-07: 4 mg via INTRAVENOUS

## 2020-01-07 MED ORDER — KETOROLAC TROMETHAMINE 30 MG/ML IJ SOLN: 30.0000 mg | Freq: Once | INTRAMUSCULAR | Status: DC

## 2020-01-07 MED ORDER — ALBUTEROL SULFATE HFA 108 (90 BASE) MCG/ACT IN AERS
INHALATION_SPRAY | RESPIRATORY_TRACT | Status: DC | PRN
  Administered 2020-01-07 (×2): 2 via RESPIRATORY_TRACT

## 2020-01-07 MED ORDER — DEXAMETHASONE SODIUM PHOSPHATE 10 MG/ML IJ SOLN
INTRAMUSCULAR | Status: DC | PRN
  Administered 2020-01-07: 10 mg via INTRAVENOUS

## 2020-01-07 MED ORDER — LACTATED RINGERS IV SOLN: INTRAVENOUS | Status: DC

## 2020-01-07 MED ORDER — SUCCINYLCHOLINE CHLORIDE 200 MG/10ML IV SOSY
PREFILLED_SYRINGE | INTRAVENOUS | Status: DC | PRN
  Administered 2020-01-07: 120 mg via INTRAVENOUS

## 2020-01-07 MED ORDER — POVIDONE-IODINE 10 % EX SWAB: 2.0000 "application " | Freq: Once | CUTANEOUS | Status: DC

## 2020-01-07 SURGICAL SUPPLY — 38 items
ADH SKN CLS APL DERMABOND .7 (GAUZE/BANDAGES/DRESSINGS) ×1
BAG SPEC RTRVL LRG 6X4 10 (ENDOMECHANICALS) ×1
CABLE HIGH FREQUENCY MONO STRZ (ELECTRODE) IMPLANT
CATH ROBINSON RED A/P 16FR (CATHETERS) ×3 IMPLANT
DECANTER SPIKE VIAL GLASS SM (MISCELLANEOUS) ×3 IMPLANT
DERMABOND ADVANCED (GAUZE/BANDAGES/DRESSINGS) ×2
DERMABOND ADVANCED .7 DNX12 (GAUZE/BANDAGES/DRESSINGS) ×1 IMPLANT
DRSG OPSITE POSTOP 3X4 (GAUZE/BANDAGES/DRESSINGS) ×2 IMPLANT
DURAPREP 26ML APPLICATOR (WOUND CARE) ×3 IMPLANT
FILTER SMOKE EVAC LAPAROSHD (FILTER) ×3 IMPLANT
GLOVE BIO SURGEON STRL SZ 6.5 (GLOVE) ×2 IMPLANT
GLOVE BIO SURGEONS STRL SZ 6.5 (GLOVE) ×1
GLOVE BIOGEL PI IND STRL 7.0 (GLOVE) ×1 IMPLANT
GLOVE BIOGEL PI INDICATOR 7.0 (GLOVE) ×2
GOWN STRL REUS W/ TWL LRG LVL3 (GOWN DISPOSABLE) ×2 IMPLANT
GOWN STRL REUS W/TWL LRG LVL3 (GOWN DISPOSABLE) ×6
KIT TURNOVER KIT B (KITS) ×3 IMPLANT
MANIPULATOR UTERINE 7CM CLEARV (MISCELLANEOUS) ×2 IMPLANT
NS IRRIG 1000ML POUR BTL (IV SOLUTION) ×3 IMPLANT
PACK LAPAROSCOPY BASIN (CUSTOM PROCEDURE TRAY) ×3 IMPLANT
PACK TRENDGUARD 450 HYBRID PRO (MISCELLANEOUS) ×1 IMPLANT
POUCH SPECIMEN RETRIEVAL 10MM (ENDOMECHANICALS) ×2 IMPLANT
PROTECTOR NERVE ULNAR (MISCELLANEOUS) ×6 IMPLANT
SET IRRIG TUBING LAPAROSCOPIC (IRRIGATION / IRRIGATOR) ×2 IMPLANT
SET TUBE SMOKE EVAC HIGH FLOW (TUBING) ×3 IMPLANT
SHEARS HARMONIC ACE PLUS 36CM (ENDOMECHANICALS) ×2 IMPLANT
SLEEVE ENDOPATH XCEL 5M (ENDOMECHANICALS) ×3 IMPLANT
SOLUTION ELECTROLUBE (MISCELLANEOUS) IMPLANT
SUT VIC AB 3-0 PS2 18 (SUTURE) ×3
SUT VIC AB 3-0 PS2 18XBRD (SUTURE) ×1 IMPLANT
SUT VICRYL 0 UR6 27IN ABS (SUTURE) IMPLANT
SYSTEM CARTER THOMASON II (TROCAR) ×2 IMPLANT
TOWEL GREEN STERILE FF (TOWEL DISPOSABLE) ×6 IMPLANT
TRENDGUARD 450 HYBRID PRO PACK (MISCELLANEOUS) ×3
TROCAR XCEL NON-BLD 11X100MML (ENDOMECHANICALS) ×3 IMPLANT
TROCAR XCEL NON-BLD 5MMX100MML (ENDOMECHANICALS) ×3 IMPLANT
TUBING IRRIGATION (TUBING) ×2 IMPLANT
WARMER LAPAROSCOPE (MISCELLANEOUS) ×3 IMPLANT

## 2020-01-07 NOTE — Anesthesia Preprocedure Evaluation (Addendum)
Anesthesia Evaluation  Patient identified by MRN, date of birth, ID band Patient awake    Reviewed: Allergy & Precautions, NPO status , Patient's Chart, lab work & pertinent test results  History of Anesthesia Complications Negative for: history of anesthetic complications  Airway Mallampati: II  TM Distance: >3 FB Neck ROM: Full    Dental no notable dental hx. (+) Dental Advisory Given   Pulmonary Current SmokerPatient did not abstain from smoking.,    Pulmonary exam normal        Cardiovascular hypertension, Normal cardiovascular exam     Neuro/Psych negative neurological ROS  negative psych ROS   GI/Hepatic negative GI ROS, Neg liver ROS,   Endo/Other  negative endocrine ROS  Renal/GU negative Renal ROS     Musculoskeletal negative musculoskeletal ROS (+)   Abdominal   Peds  Hematology negative hematology ROS (+)   Anesthesia Other Findings   Reproductive/Obstetrics (+) Pregnancy Ectopic                            Anesthesia Physical Anesthesia Plan  ASA: II and emergent  Anesthesia Plan: General   Post-op Pain Management:    Induction: Intravenous, Rapid sequence and Cricoid pressure planned  PONV Risk Score and Plan: 4 or greater and Ondansetron, Dexamethasone, Midazolam and Scopolamine patch - Pre-op  Airway Management Planned: Oral ETT  Additional Equipment:   Intra-op Plan:   Post-operative Plan: Extubation in OR  Informed Consent: I have reviewed the patients History and Physical, chart, labs and discussed the procedure including the risks, benefits and alternatives for the proposed anesthesia with the patient or authorized representative who has indicated his/her understanding and acceptance.     Dental advisory given  Plan Discussed with: Anesthesiologist and CRNA  Anesthesia Plan Comments:        Anesthesia Quick Evaluation

## 2020-01-07 NOTE — MAU Note (Signed)
Patient states she had a positive HPT.  Had intercourse on 11/15.  Patient states her VB never stopped since a few days after her MTX injection.  Denies abdominal pain.

## 2020-01-07 NOTE — Op Note (Signed)
Operative Note    Preoperative Diagnosis: Ectopic pregnancy on right adnexa  Postoperative Diagnosis: Ectopic pregnancy in right salpinx   Procedure:  laparoscopic right salpingectomy and removal of ectopic pregnancy   Surgeon: Kocurek Bottom DO Anesthesia: General  Fluids: LR  2L EBL: <20ml UOP: 45ml   Findings: Ectopic pregnancy housed in right salpinx- intact with small protrusion from fimbria. Grossly normal uterus, bilateral ovaries and left fallopian tube. Old blood diffusely in abdomen   Specimen: Right fallopian tube and ectopic pregnancy   Procedure Note  Patient was taken to the operating room. She was placed in the dorsal supine position with her arms tucked at her sides. General anesthesia was administered and found to be adequate. She was then placed in the dorsal lithotomy position and prepped and draped in the normal sterile fashion. An appropriate timeout was performed. A speculum was then placed in the vagina and a clearView uterine manipulator placed. The bladder was emptied. Attention was then turned to the patient's abdomen after draping where the infraumbilical area was injected with approximately 5cc of quarter percent Marcaine. A 5 mm incision was then made within the umbilicus and the optiview trocar and camera easily introduced into the abdominal cavity with the abdomen tented upwards.Pneumoperitoneum obtained with approximate 3 L of CO2 gas to . A second 80mm trocar was placed in the patients left lower uterine quadrant under direct visualization.   With patient in Trendelenburg, the pelvic cavity was noted to have a moderate amount of blood diffusely; no active bleeding appreciated.A third 41mm trocar was then placed in the right lower cavity under visualization and the umbilical site extended to accommodate an 82mm port. The uterus was anteverted and the right salpinx noted to be enlarged with ectopic pregnancy. The rest of the anatomy was grossly normal.   The  right fallopian the was then excised hemostatically using the harmonic scalpel from the ovary and mesosalpinx. The specimen was then placed in an endocatch bag and removed intact.  Copious irrigation of the pelvic cavity was performed. The left fallopian tube appeared to be in normal condition.  Thus, at the point the pt was flattened. The left and right trocars were removed under direct visualization. Pneumoperitoneum was reduced. The umbilical trocar was then removed. The umbilical site was closed with 4-0 on a u- shaped needle with care taken to reapprximate the fascia. Next the incision sites were all closed with 4-0 vicryl suture and dermabond. 0.25% marcaine was injected in all sites.  . Patient was then awakened and taken to the recovery room in good condition. Counts were all confirmed per nursing x 2

## 2020-01-07 NOTE — Transfer of Care (Signed)
Immediate Anesthesia Transfer of Care Note  Patient: Deborah Brock  Procedure(s) Performed: DIAGNOSTIC LAPAROSCOPY WITH REMOVAL OF ECTOPIC PREGNANCY (Right )  Patient Location: PACU  Anesthesia Type:General  Level of Consciousness: awake, alert , oriented and patient cooperative  Airway & Oxygen Therapy: Patient Spontanous Breathing and Patient connected to face mask oxygen  Post-op Assessment: Report given to RN, Post -op Vital signs reviewed and stable and Patient moving all extremities X 4  Post vital signs: Reviewed and stable  Last Vitals:  Vitals Value Taken Time  BP 136/85 01/07/20 2039  Temp    Pulse 88 01/07/20 2040  Resp 19 01/07/20 2040  SpO2 100 % 01/07/20 2040  Vitals shown include unvalidated device data.  Last Pain:  Vitals:   01/07/20 1224  PainSc: 0-No pain         Complications: No complications documented.

## 2020-01-07 NOTE — MAU Provider Note (Signed)
History     CSN: [id number]  Arrival date and time: 01/07/20 1202   First Provider Initiated Contact with Patient 01/07/20 1231      Chief Complaint  Patient presents with   Possible Pregnancy   HPI Deborah Brock is 34 y.o. [id number] who presents to MAU with chief compliant of vaginal bleeding in the setting of positive home pregnancy test. She is s/p Methotrexate administration on 12/19/2019, indicated by suspicion for left ectopic pregnancy. Patient states she has been bleeding almost every day since her MTX administration. She endorses unprotected intercourse sometime between 11/11 and 11/15. She is concerned that she has a new pregnancy.  She denies abdominal pain, low back pain, pelvic pressure, dysuria, fever or recent illness.  Patient receives care with [facility]. She reports adhering to following in the office and that she was reassured her hCG was decreasing appropriately.   OB History    Gravida  8   Para  3   Term  3   Preterm  0   AB  4   Living  3     SAB  2   TAB  2   Ectopic  0   Multiple  0   Live Births  1           Past Medical History:  Diagnosis Date   Hypertension    No pertinent past medical history     Past Surgical History:  Procedure Laterality Date   BREAST SURGERY     Augmentation 10/2013   THERAPEUTIC ABORTION     May 2016   TONSILLECTOMY      Family History  Problem Relation Age of Onset   Hypertension Mother    Diabetes Mother     Social History   Tobacco Use   Smoking status: Current Every Day Smoker   Smokeless tobacco: Never Used  Substance Use Topics   Alcohol use: No    Comment: socially   Drug use: Yes    Types: Marijuana    Comment: every other day    Allergies:  Allergies  Allergen Reactions   Hydrocodone-Acetaminophen Nausea Only    Made face feel flushed; felt hot     Medications Prior to Admission  Medication Sig Dispense Refill Last Dose   albuterol (PROVENTIL  HFA;VENTOLIN HFA) 108 (90 BASE) MCG/ACT inhaler Inhale 2 puffs into the lungs every 6 (six) hours as needed for wheezing or shortness of breath.        Review of Systems  Gastrointestinal: Negative for abdominal pain.  Genitourinary: Positive for vaginal bleeding.  All other systems reviewed and are negative.  Physical Exam   Blood pressure 128/90, pulse 81, temperature 98.6 F (37 C), resp. rate 17, weight 71.8 kg, last menstrual period 11/23/2019, unknown if currently breastfeeding.  Physical Exam Vitals and nursing note reviewed. Exam conducted with a chaperone present.  Cardiovascular:     Rate and Rhythm: Normal rate.     Pulses: Normal pulses.     Heart sounds: Normal heart sounds.  Genitourinary:    Comments: Scant bleeding on pad worn to MAU Skin:    Capillary Refill: Capillary refill takes less than 2 seconds.  Neurological:     General: No focal deficit present.     Mental Status: She is alert.  Psychiatric:        Mood and Affect: Mood normal.        Behavior: Behavior normal.        Thought Content: Thought  content normal.        Judgment: Judgment normal.    MAU Course  Procedures    --Per MAU policy, ROS, results including imaging discussed with Dr. Earlene Plater, Faculty Attending. Patient currently stable with scant bleeding and decreasing quant hCG. Eligible for close outpatient monitoring with strict return precautions. Coordination with Dr. Mindi Slicker indicated to finalize next steps  2:11pm --Initial phone call to Dr. Mindi Slicker. ROS, results including imaging reviewed. Dr. Mindi Slicker advises surgical intervention. Requests 30 minutes to review patient information in office and check OR schedule and then return my phone call. Requests patient be prepped for OR including COVID swab. Pt updated.   2:20pm --Dr. Mindi Slicker contacted by CNM to clarify patient has been NPO since 3am today  2:37pm --Dr. Mindi Slicker called MAU, spoke to RN and CNM. Verbalized to CNM on further review of  long-term change she advised close outpatient follow-up. Update provided to patient. Patient verbalizes she does not feel comfortable with outpatient f/u. Requests operative solution. Dr. Brennan Bailey agrees  Results for orders placed or performed during the hospital encounter of 01/07/20 (from the past 24 hour(s))  CBC     Status: None   Collection Time: 01/07/20 12:56 PM  Result Value Ref Range   WBC 8.0 4.0 - 10.5 K/uL   RBC 4.70 3.87 - 5.11 MIL/uL   Hemoglobin 13.4 12.0 - 15.0 g/dL   HCT 73.7 36 - 46 %   MCV 87.4 80.0 - 100.0 fL   MCH 28.5 26.0 - 34.0 pg   MCHC 32.6 30.0 - 36.0 g/dL   RDW 10.6 26.9 - 48.5 %   Platelets 254 150 - 400 K/uL   nRBC 0.0 0.0 - 0.2 %  hCG, quantitative, pregnancy     Status: Abnormal   Collection Time: 01/07/20 12:56 PM  Result Value Ref Range   hCG, Beta Chain, Quant, S 802 (H) <5 mIU/mL  Respiratory Panel by RT PCR (Flu A&B, Covid) - Nasopharyngeal Swab     Status: None   Collection Time: 01/07/20  2:24 PM   Specimen: Nasopharyngeal Swab; Nasopharyngeal(NP) swabs in vial transport medium  Result Value Ref Range   SARS Coronavirus 2 by RT PCR NEGATIVE NEGATIVE   Influenza A by PCR NEGATIVE NEGATIVE   Influenza B by PCR NEGATIVE NEGATIVE  Comprehensive metabolic panel     Status: Abnormal   Collection Time: 01/07/20  2:38 PM  Result Value Ref Range   Sodium 138 135 - 145 mmol/L   Potassium 3.8 3.5 - 5.1 mmol/L   Chloride 106 98 - 111 mmol/L   CO2 22 22 - 32 mmol/L   Glucose, Bld 95 70 - 99 mg/dL   BUN 7 6 - 20 mg/dL   Creatinine, Ser 4.62 0.44 - 1.00 mg/dL   Calcium 8.6 (L) 8.9 - 10.3 mg/dL   Total Protein 6.2 (L) 6.5 - 8.1 g/dL   Albumin 3.6 3.5 - 5.0 g/dL   AST 12 (L) 15 - 41 U/L   ALT 12 0 - 44 U/L   Alkaline Phosphatase 78 38 - 126 U/L   Total Bilirubin 0.8 0.3 - 1.2 mg/dL   GFR, Estimated >70 >35 mL/min   Anion gap 10 5 - 15  Type and screen [facility]     Status: None   Collection Time: 01/07/20  2:38 PM  Result Value Ref  Range   ABO/RH(D) O POS    Antibody Screen NEG    Sample Expiration  01/10/2020,2359 Performed at [facility], [address], [place], [place] [address]    [place] Transvaginal  Result Date: 01/07/2020 CLINICAL DATA:  Vaginal bleeding. Left tubal ectopic pregnancy status post methotrexate 12/19/2019 EXAM: TRANSVAGINAL OB ULTRASOUND TECHNIQUE: Transvaginal ultrasound was performed for complete evaluation of the gestation as well as the maternal uterus, adnexal regions, and pelvic cul-de-sac. COMPARISON:  12/23/2019 FINDINGS: Intrauterine gestational sac: None Yolk sac:  Not Visualized. Embryo:  Not Visualized. Maternal uterus/adnexae: Retroverted uterus. Trace fluid within the endometrial canal. Left ovary measures 2.6 x 1.71.9 cm and appears unremarkable. Redemonstration of a heterogeneous left adnexal mass with peripheral vascularity measuring approximately 3.7 x 2.3 x 2.3 cm (previously measured approximately 2.2 x 1.7 x 1.6 cm on 12/23/2019). No live embryo is evident within the mass. Right ovary measures 2.7 x 1.4 x 2.0 cm and appears unremarkable. No significant free fluid within the cul-de-sac. No evidence of hemoperitoneum. IMPRESSION: 1. Heterogeneous left adnexal mass compatible with known ectopic pregnancy has slightly increased in size from prior study now measuring up to 3.7 cm (previously 2.2 cm on 12/23/2019). No free fluid or hemoperitoneum to suggest rupture. 2. No evidence of intrauterine pregnancy. 3. Unremarkable ovaries. These results were called by telephone at the time of interpretation on 01/07/2020 at 1:24 pm to patient's nurse Tobi Bastos, who verbally acknowledged these results. Electronically Signed   By: Duanne Guess D.O.   On: 01/07/2020 13:28   Assessment and Plan  --34 y.o. [id number] at [redacted]w[redacted]d  --S/p MTX 12/19/2019 --Per Dr. Mindi Slicker prep for OR  Calvert Cantor, CNM 01/07/2020, 4:33 PM

## 2020-01-07 NOTE — Discharge Summary (Signed)
Physician Discharge Summary  Patient ID: Deborah Brock MRN: [id number] DOB/AGE: 08-21-1985 34 y.o.  Admit date: 01/07/2020 Discharge date: 01/07/2020  Admission Diagnoses:Ectopic pregnancy  Discharge Diagnoses: Same Active Problems:   * No active hospital problems. *   Discharged Condition: stable  Hospital Course: pt admitted for surgical management of stable ectopic pregnancy  Consults: None  Significant Diagnostic Studies: labs: Hcg quant  and Ultrasound  Treatments: surgery:  laparoscopic right salpingectomy and removal of ectopic pregnancy  Discharge Exam: Blood pressure 117/73, pulse 81, temperature 98.6 F (37 C), resp. rate 17, weight 71.8 kg, last menstrual period 11/23/2019, unknown if currently breastfeeding. General appearance: alert and no distress GI: soft, non-tender; bowel sounds normal; no masses,  no organomegaly  Disposition: Discharge disposition: 01-Home or Self Care       Discharge Instructions    Call MD for:  difficulty breathing, headache or visual disturbances   Complete by: As directed    Call MD for:  persistant nausea and vomiting   Complete by: As directed    Call MD for:  redness, tenderness, or signs of infection (pain, swelling, redness, odor or green/yellow discharge around incision site)   Complete by: As directed    Call MD for:  severe uncontrolled pain   Complete by: As directed    Call MD for:  temperature >100.4   Complete by: As directed    Diet - low sodium heart healthy   Complete by: As directed    Discharge instructions   Complete by: As directed    Call office with any concerns [phone]   Driving Restrictions   Complete by: As directed    None for 48 hours after anesthesia administered and none while taking narcotic medication   Increase activity slowly   Complete by: As directed    Lifting restrictions   Complete by: As directed    <15lbs   Sexual Activity Restrictions   Complete by: As directed     None till post operative visit     Allergies as of 01/07/2020      Reactions   Hydrocodone-acetaminophen Nausea Only   Made face feel flushed; felt hot       Medication List    TAKE these medications   acetaminophen 325 MG tablet Commonly known as: Tylenol Take 2 tablets (650 mg total) by mouth every 4 (four) hours as needed for moderate pain.   albuterol 108 (90 Base) MCG/ACT inhaler Commonly known as: VENTOLIN HFA Inhale 2 puffs into the lungs every 6 (six) hours as needed for wheezing or shortness of breath.   oxyCODONE-acetaminophen 5-325 MG tablet Commonly known as: Percocet Take 1 tablet by mouth every 4 (four) hours as needed for up to 5 days for severe pain.       Follow-up Information    Edwinna Areola, DO.   Specialty: Obstetrics and Gynecology Why: 10-14 days for post operative visit Contact information: [address] [place] [address] [phone]               Signed: Cathrine Muster 01/07/2020, 8:36 PM

## 2020-01-07 NOTE — Discharge Instructions (Signed)
Call office with any concerns [phone] 

## 2020-01-07 NOTE — H&P (Signed)
Deborah Brock is an 34 y.o. [phone] female with ectopic pregnancy for surgical excision. Pt presented to MAU today due to positive pregnancy test at home. Pt has been followed since 12/20/19 when received methotrexate for noted ectopic pregnancy. Her quant is now at 800 from as high at 3400. Pt admits to unprotected sex in the interim. Pt denies abdominal pain but has had bleeding since received methotrexate. Pt was offered continued conservative treatment per methotrexate protocol but she requests surgical management today.   Pertinent Gynecological History: Menses: flow is moderate Bleeding: dysfunctional uterine bleeding Contraception: none DES exposure: unknown Blood transfusions: none Sexually transmitted diseases: hsv Previous GYN Procedures: n/a  Last mammogram: n/a Date:  Last pap: normal Date:  OB History: G8, P3053   Menstrual History: Menarche age: 89 Patient's last menstrual period was 11/23/2019.    Past Medical History:  Diagnosis Date  . Hypertension   . No pertinent past medical history     Past Surgical History:  Procedure Laterality Date  . BREAST SURGERY     Augmentation 10/2013  . THERAPEUTIC ABORTION     May 2016  . TONSILLECTOMY      Family History  Problem Relation Age of Onset  . Hypertension Mother   . Diabetes Mother     Social History:  reports that she has been smoking. She has never used smokeless tobacco. She reports current drug use. Drug: Marijuana. She reports that she does not drink alcohol.  Allergies:  Allergies  Allergen Reactions  . Hydrocodone-Acetaminophen Nausea Only    Made face feel flushed; felt hot     Medications Prior to Admission  Medication Sig Dispense Refill Last Dose  . albuterol (PROVENTIL HFA;VENTOLIN HFA) 108 (90 BASE) MCG/ACT inhaler Inhale 2 puffs into the lungs every 6 (six) hours as needed for wheezing or shortness of breath.        Review of Systems  Constitutional: Negative for activity change and  fatigue.  Eyes: Negative for photophobia and visual disturbance.  Respiratory: Negative for chest tightness and shortness of breath.   Cardiovascular: Negative for chest pain, palpitations and leg swelling.  Gastrointestinal: Negative for abdominal pain.  Genitourinary: Positive for vaginal bleeding. Negative for pelvic pain.  Musculoskeletal: Negative for myalgias.  Psychiatric/Behavioral: The patient is nervous/anxious.     Blood pressure 117/73, pulse 81, temperature 98.6 F (37 C), resp. rate 17, weight 71.8 kg, last menstrual period 11/23/2019, unknown if currently breastfeeding. Physical Exam Vitals and nursing note reviewed. Exam conducted with a chaperone present.  Constitutional:      Appearance: Normal appearance.  Pulmonary:     Effort: Pulmonary effort is normal.  Musculoskeletal:     Cervical back: Normal range of motion.  Skin:    General: Skin is warm.     Capillary Refill: Capillary refill takes 2 to 3 seconds.  Neurological:     General: No focal deficit present.     Mental Status: She is alert and oriented to person, place, and time. Mental status is at baseline.  Psychiatric:        Mood and Affect: Mood normal.     Results for orders placed or performed during the hospital encounter of 01/07/20 (from the past 24 hour(s))  CBC     Status: None   Collection Time: 01/07/20 12:56 PM  Result Value Ref Range   WBC 8.0 4.0 - 10.5 K/uL   RBC 4.70 3.87 - 5.11 MIL/uL   Hemoglobin 13.4 12.0 - 15.0 g/dL  HCT 41.1 36 - 46 %   MCV 87.4 80.0 - 100.0 fL   MCH 28.5 26.0 - 34.0 pg   MCHC 32.6 30.0 - 36.0 g/dL   RDW 00.9 38.1 - 82.9 %   Platelets 254 150 - 400 K/uL   nRBC 0.0 0.0 - 0.2 %  hCG, quantitative, pregnancy     Status: Abnormal   Collection Time: 01/07/20 12:56 PM  Result Value Ref Range   hCG, Beta Chain, Quant, S 802 (H) <5 mIU/mL  Respiratory Panel by RT PCR (Flu A&B, Covid) - Nasopharyngeal Swab     Status: None   Collection Time: 01/07/20  2:24 PM    Specimen: Nasopharyngeal Swab; Nasopharyngeal(NP) swabs in vial transport medium  Result Value Ref Range   SARS Coronavirus 2 by RT PCR NEGATIVE NEGATIVE   Influenza A by PCR NEGATIVE NEGATIVE   Influenza B by PCR NEGATIVE NEGATIVE  Comprehensive metabolic panel     Status: Abnormal   Collection Time: 01/07/20  2:38 PM  Result Value Ref Range   Sodium 138 135 - 145 mmol/L   Potassium 3.8 3.5 - 5.1 mmol/L   Chloride 106 98 - 111 mmol/L   CO2 22 22 - 32 mmol/L   Glucose, Bld 95 70 - 99 mg/dL   BUN 7 6 - 20 mg/dL   Creatinine, Ser 9.37 0.44 - 1.00 mg/dL   Calcium 8.6 (L) 8.9 - 10.3 mg/dL   Total Protein 6.2 (L) 6.5 - 8.1 g/dL   Albumin 3.6 3.5 - 5.0 g/dL   AST 12 (L) 15 - 41 U/L   ALT 12 0 - 44 U/L   Alkaline Phosphatase 78 38 - 126 U/L   Total Bilirubin 0.8 0.3 - 1.2 mg/dL   GFR, Estimated >16 >96 mL/min   Anion gap 10 5 - 15  Type and screen [facility]     Status: None   Collection Time: 01/07/20  2:38 PM  Result Value Ref Range   ABO/RH(D) O POS    Antibody Screen NEG    Sample Expiration      01/10/2020,2359 Performed at [facility], [address], [place], [place] [address]     [place] Transvaginal  Result Date: 01/07/2020 CLINICAL DATA:  Vaginal bleeding. Left tubal ectopic pregnancy status post methotrexate 12/19/2019 EXAM: TRANSVAGINAL OB ULTRASOUND TECHNIQUE: Transvaginal ultrasound was performed for complete evaluation of the gestation as well as the maternal uterus, adnexal regions, and pelvic cul-de-sac. COMPARISON:  12/23/2019 FINDINGS: Intrauterine gestational sac: None Yolk sac:  Not Visualized. Embryo:  Not Visualized. Maternal uterus/adnexae: Retroverted uterus. Trace fluid within the endometrial canal. Left ovary measures 2.6 x 1.71.9 cm and appears unremarkable. Redemonstration of a heterogeneous left adnexal mass with peripheral vascularity measuring approximately 3.7 x 2.3 x 2.3 cm (previously measured approximately 2.2 x 1.7 x 1.6 cm on  12/23/2019). No live embryo is evident within the mass. Right ovary measures 2.7 x 1.4 x 2.0 cm and appears unremarkable. No significant free fluid within the cul-de-sac. No evidence of hemoperitoneum. IMPRESSION: 1. Heterogeneous left adnexal mass compatible with known ectopic pregnancy has slightly increased in size from prior study now measuring up to 3.7 cm (previously 2.2 cm on 12/23/2019). No free fluid or hemoperitoneum to suggest rupture. 2. No evidence of intrauterine pregnancy. 3. Unremarkable ovaries. These results were called by telephone at the time of interpretation on 01/07/2020 at 1:24 pm to patient's nurse Tobi Bastos, who verbally acknowledged these results. Electronically Signed  By: Duanne Guess D.O.   On: 01/07/2020 13:28    Assessment/Plan: Pt to OR when ready for surgical removal of ectopic pregnancy, possible salpingectomy, possible oopherectomy, possible open Review procedure with risks/benefits and alternative - continued methotrexate protocol ERAS protocol To OR when ready  Janean Sark Rylen Hou 01/07/2020, 6:28 PM

## 2020-01-07 NOTE — Anesthesia Procedure Notes (Signed)
Procedure Name: Intubation Date/Time: 01/07/2020 7:00 PM Performed by: Alvy Bimler, CRNA Pre-anesthesia Checklist: Patient identified, Patient being monitored, Timeout performed, Emergency Drugs available and Suction available Patient Re-evaluated:Patient Re-evaluated prior to induction Oxygen Delivery Method: Circle System Utilized Preoxygenation: Pre-oxygenation with 100% oxygen Induction Type: IV induction Ventilation: Mask ventilation without difficulty Laryngoscope Size: Mac and 3 Grade View: Grade III Tube type: Oral Tube size: 7.0 mm Number of attempts: 2 Airway Equipment and Method: stylet Placement Confirmation: ETT inserted through vocal cords under direct vision,  positive ETCO2 and breath sounds checked- equal and bilateral Secured at: 21 cm Tube secured with: Tape Dental Injury: Teeth and Oropharynx as per pre-operative assessment

## 2020-01-08 ENCOUNTER — Encounter (HOSPITAL_COMMUNITY): Payer: Self-pay | Admitting: Obstetrics and Gynecology

## 2020-01-10 LAB — SURGICAL PATHOLOGY

## 2020-01-10 NOTE — Anesthesia Postprocedure Evaluation (Signed)
Anesthesia Post Note  Patient: Deborah Brock  Procedure(s) Performed: DIAGNOSTIC LAPAROSCOPY WITH REMOVAL OF ECTOPIC PREGNANCY (Right )     Patient location during evaluation: PACU Anesthesia Type: General Level of consciousness: awake and alert Pain management: pain level controlled Vital Signs Assessment: post-procedure vital signs reviewed and stable Respiratory status: spontaneous breathing, nonlabored ventilation, respiratory function stable and patient connected to nasal cannula oxygen Cardiovascular status: blood pressure returned to baseline and stable Postop Assessment: no apparent nausea or vomiting Anesthetic complications: no   No complications documented.  Last Vitals:  Vitals:   01/07/20 2108 01/07/20 2115  BP: 113/78   Pulse: 70 68  Resp: 20 20  Temp:  36.7 C  SpO2: 97% (!) 83%    Last Pain:  Vitals:   01/07/20 2039  PainSc: Asleep                 Durward Matranga S

## 2020-08-10 ENCOUNTER — Inpatient Hospital Stay (HOSPITAL_COMMUNITY)
Admission: AD | Admit: 2020-08-10 | Discharge: 2020-08-10 | Disposition: A | Discharge status: Home / Self Care | Payer: Medicaid Other | Attending: Obstetrics and Gynecology | Admitting: Obstetrics and Gynecology

## 2020-08-10 ENCOUNTER — Encounter (HOSPITAL_COMMUNITY): Payer: Self-pay | Admitting: Obstetrics and Gynecology

## 2020-08-10 ENCOUNTER — Inpatient Hospital Stay (HOSPITAL_COMMUNITY): Payer: Medicaid Other

## 2020-08-10 ENCOUNTER — Other Ambulatory Visit: Payer: Self-pay

## 2020-08-10 DIAGNOSIS — O26891 Other specified pregnancy related conditions, first trimester: Secondary | ICD-10-CM | POA: Diagnosis not present

## 2020-08-10 DIAGNOSIS — R1031 Right lower quadrant pain: Secondary | ICD-10-CM | POA: Insufficient documentation

## 2020-08-10 DIAGNOSIS — Z79899 Other long term (current) drug therapy: Secondary | ICD-10-CM | POA: Diagnosis not present

## 2020-08-10 DIAGNOSIS — O26899 Other specified pregnancy related conditions, unspecified trimester: Secondary | ICD-10-CM

## 2020-08-10 DIAGNOSIS — Z8759 Personal history of other complications of pregnancy, childbirth and the puerperium: Secondary | ICD-10-CM | POA: Diagnosis not present

## 2020-08-10 DIAGNOSIS — Z885 Allergy status to narcotic agent status: Secondary | ICD-10-CM | POA: Insufficient documentation

## 2020-08-10 DIAGNOSIS — Z3A00 Weeks of gestation of pregnancy not specified: Secondary | ICD-10-CM

## 2020-08-10 DIAGNOSIS — O3680X0 Pregnancy with inconclusive fetal viability, not applicable or unspecified: Secondary | ICD-10-CM

## 2020-08-10 DIAGNOSIS — R102 Pelvic and perineal pain: Secondary | ICD-10-CM | POA: Insufficient documentation

## 2020-08-10 LAB — CBC
HCT: 44.2 % (ref 36.0–46.0)
Hemoglobin: 14.1 g/dL (ref 12.0–15.0)
MCH: 27.9 pg (ref 26.0–34.0)
MCHC: 31.9 g/dL (ref 30.0–36.0)
MCV: 87.4 fL (ref 80.0–100.0)
Platelets: 349 10*3/uL (ref 150–400)
RBC: 5.06 MIL/uL (ref 3.87–5.11)
RDW: 12.4 % (ref 11.5–15.5)
WBC: 10.9 10*3/uL — ABNORMAL HIGH (ref 4.0–10.5)
nRBC: 0 % (ref 0.0–0.2)

## 2020-08-10 LAB — HCG, QUANTITATIVE, PREGNANCY: hCG, Beta Chain, Quant, S: 24 m[IU]/mL — ABNORMAL HIGH (ref <5)

## 2020-08-10 LAB — POCT PREGNANCY, URINE: Preg Test, Ur: NEGATIVE

## 2020-08-10 NOTE — MAU Note (Addendum)
Presents with c/o right sided lower abdominal that began yesterday.  Denies VB.  LMP Jun 14, 2020.  Reports +HPT.

## 2020-08-10 NOTE — MAU Provider Note (Addendum)
History     CSN: [id number]  Arrival date and time: 08/10/20 1402    Chief Complaint  Patient presents with   Abdominal Pain   HPI 34yo [id number] presents with RLQ abdominal pain that started yesterday. No radiation of pain. Has history of ectopic pregnancy. No palliating or provoking factors. Had home UPT that was positive.  OB History     Gravida  9   Para  3   Term  3   Preterm  0   AB  4   Living  3      SAB  2   IAB  2   Ectopic  0   Multiple  0   Live Births  1           Past Medical History:  Diagnosis Date   Hypertension    No pertinent past medical history     Past Surgical History:  Procedure Laterality Date   BREAST SURGERY     Augmentation 10/2013   DIAGNOSTIC LAPAROSCOPY WITH REMOVAL OF ECTOPIC PREGNANCY Right 01/07/2020   Procedure: DIAGNOSTIC LAPAROSCOPY WITH REMOVAL OF ECTOPIC PREGNANCY;  Surgeon: Edwinna Areola, DO;  Location: [facility] OR;  Service: Gynecology;  Laterality: Right;   THERAPEUTIC ABORTION     May 2016   TONSILLECTOMY      Family History  Problem Relation Age of Onset   Hypertension Mother    Diabetes Mother     Social History   Tobacco Use   Smoking status: Every Day    Pack years: 0.00   Smokeless tobacco: Never  Substance Use Topics   Alcohol use: No    Comment: socially   Drug use: Yes    Types: Marijuana    Comment: every other day    Allergies:  Allergies  Allergen Reactions   Hydrocodone-Acetaminophen Nausea Only    Made face feel flushed; felt hot     Medications Prior to Admission  Medication Sig Dispense Refill Last Dose   acetaminophen (TYLENOL) 325 MG tablet Take 2 tablets (650 mg total) by mouth every 4 (four) hours as needed for moderate pain. 40 tablet 1    albuterol (PROVENTIL HFA;VENTOLIN HFA) 108 (90 BASE) MCG/ACT inhaler Inhale 2 puffs into the lungs every 6 (six) hours as needed for wheezing or shortness of breath.        Review of Systems  All other systems reviewed and  are negative. Physical Exam   Blood pressure 122/81, pulse (!) 57, temperature 98 F (36.7 C), temperature source Oral, resp. rate 20, height 5\' 1"  (1.549 m), weight 71.3 kg, last menstrual period 06/14/2020, SpO2 100 %, unknown if currently breastfeeding.  Physical Exam Vitals and nursing note reviewed.  Constitutional:      Appearance: She is well-developed.  HENT:     Head: Normocephalic and atraumatic.  Eyes:     Extraocular Movements: Extraocular movements intact.  Abdominal:     General: Bowel sounds are normal.     Palpations: Abdomen is soft.     Tenderness: There is abdominal tenderness in the right lower quadrant. There is no guarding or rebound.  Genitourinary:    Rectum: Guaiac result negative. No mass or tenderness.  Neurological:     Mental Status: She is alert.   Results for orders placed or performed during the hospital encounter of 08/10/20 (from the past 24 hour(s))  Pregnancy, urine POC     Status: None   Collection Time: 08/10/20  3:01 PM  Result Value Ref  Range   Preg Test, Ur NEGATIVE NEGATIVE  hCG, quantitative, pregnancy     Status: Abnormal   Collection Time: 08/10/20  3:18 PM  Result Value Ref Range   hCG, Beta Chain, Quant, S 24 (H) <5 mIU/mL  CBC     Status: Abnormal   Collection Time: 08/10/20  3:18 PM  Result Value Ref Range   WBC 10.9 (H) 4.0 - 10.5 K/uL   RBC 5.06 3.87 - 5.11 MIL/uL   Hemoglobin 14.1 12.0 - 15.0 g/dL   HCT 37.8 58.8 - 50.2 %   MCV 87.4 80.0 - 100.0 fL   MCH 27.9 26.0 - 34.0 pg   MCHC 31.9 30.0 - 36.0 g/dL   RDW 77.4 12.8 - 78.6 %   Platelets 349 150 - 400 K/uL   nRBC 0.0 0.0 - 0.2 %     MAU Course  Procedures Imaging: I independent reviewed the images of the ultrasound. No ectopic pregnancy visualized. No IUP visualized.    MDM  Assessment and Plan   1. Pregnancy of unknown anatomic location   2. Pelvic pain affecting pregnancy    Discharge to home. Ectopic precautions given. Follow up in office for 48 hour  quant.  Levie Heritage 08/10/2020, 6:51 PM

## 2020-08-13 ENCOUNTER — Other Ambulatory Visit: Payer: Medicaid Other

## 2020-08-13 ENCOUNTER — Other Ambulatory Visit: Payer: Self-pay

## 2020-08-13 ENCOUNTER — Inpatient Hospital Stay (HOSPITAL_COMMUNITY)
Admission: AD | Admit: 2020-08-13 | Discharge: 2020-08-13 | Disposition: A | Discharge status: Home / Self Care | Payer: Medicaid Other | Attending: Obstetrics & Gynecology | Admitting: Obstetrics & Gynecology

## 2020-08-13 DIAGNOSIS — O09291 Supervision of pregnancy with other poor reproductive or obstetric history, first trimester: Secondary | ICD-10-CM | POA: Diagnosis not present

## 2020-08-13 DIAGNOSIS — Z3A08 8 weeks gestation of pregnancy: Secondary | ICD-10-CM | POA: Insufficient documentation

## 2020-08-13 DIAGNOSIS — R109 Unspecified abdominal pain: Secondary | ICD-10-CM | POA: Insufficient documentation

## 2020-08-13 DIAGNOSIS — O26891 Other specified pregnancy related conditions, first trimester: Secondary | ICD-10-CM | POA: Insufficient documentation

## 2020-08-13 DIAGNOSIS — O3680X0 Pregnancy with inconclusive fetal viability, not applicable or unspecified: Secondary | ICD-10-CM

## 2020-08-13 LAB — URINALYSIS, ROUTINE W REFLEX MICROSCOPIC
Bilirubin Urine: NEGATIVE
Glucose, UA: NEGATIVE mg/dL
Ketones, ur: NEGATIVE mg/dL
Leukocytes,Ua: NEGATIVE
Nitrite: NEGATIVE
Protein, ur: NEGATIVE mg/dL
Specific Gravity, Urine: 1.015 (ref 1.005–1.030)
pH: 5 (ref 5.0–8.0)

## 2020-08-13 LAB — HCG, QUANTITATIVE, PREGNANCY: hCG, Beta Chain, Quant, S: 5 m[IU]/mL — ABNORMAL HIGH (ref <5)

## 2020-08-13 NOTE — Progress Notes (Signed)
Dr. Crissie Reese informed pt that HCG level decreasing.  Prior to receiving any further information regarding POC or F/U pt abruptly left unit without further instruction.

## 2020-08-13 NOTE — MAU Provider Note (Signed)
History     [id number]  Arrival date and time: 08/13/20 1458    Chief Complaint  Patient presents with   Abdominal Pain     HPI Deborah Brock is a 35 y.o. at [redacted]w[redacted]d by LMP with PMHx notable for prior ectopic pregnancy, who presents for follow up blood work.   Patient last seen in MAU three days prior on 08/10/20, at that time reported positive home pregnancy test and RLQ abdominal pain w/o vaginal bleeding Quant at that time was 24, [place] without any findings Instructed to return to the office for a repeat in 48h  Today reports similar symptoms to prior presentation Ongoing RLQ pain that is unchanged No vaginal bleeding No fevers  --/--/O POS (11/23 1438)  OB History     Gravida  9   Para  3   Term  3   Preterm  0   AB  4   Living  3      SAB  2   IAB  2   Ectopic  0   Multiple  0   Live Births  1           Past Medical History:  Diagnosis Date   Hypertension    No pertinent past medical history     Past Surgical History:  Procedure Laterality Date   BREAST SURGERY     Augmentation 10/2013   DIAGNOSTIC LAPAROSCOPY WITH REMOVAL OF ECTOPIC PREGNANCY Right 01/07/2020   Procedure: DIAGNOSTIC LAPAROSCOPY WITH REMOVAL OF ECTOPIC PREGNANCY;  Surgeon: Edwinna Areola, DO;  Location: [facility];  Service: Gynecology;  Laterality: Right;   THERAPEUTIC ABORTION     May 2016   TONSILLECTOMY      Family History  Problem Relation Age of Onset   Hypertension Mother    Diabetes Mother     Social History   Socioeconomic History   Marital status: Single    Spouse name: Not on file   Number of children: Not on file   Years of education: Not on file   Highest education level: Not on file  Occupational History   Not on file  Tobacco Use   Smoking status: Every Day    Pack years: 0.00   Smokeless tobacco: Never  Substance and Sexual Activity   Alcohol use: No    Comment: socially   Drug use: Yes    Types: Marijuana    Comment: every other day    Sexual activity: Yes    Birth control/protection: None  Other Topics Concern   Not on file  Social History Narrative   Not on file   Social Determinants of Health   Financial Resource Strain: Not on file  Food Insecurity: Not on file  Transportation Needs: Not on file  Physical Activity: Not on file  Stress: Not on file  Social Connections: Not on file  Intimate Partner Violence: Not on file    Allergies  Allergen Reactions   Hydrocodone-Acetaminophen Nausea Only    Made face feel flushed; felt hot     No current facility-administered medications on file prior to encounter.   Current Outpatient Medications on File Prior to Encounter  Medication Sig Dispense Refill   acetaminophen (TYLENOL) 325 MG tablet Take 2 tablets (650 mg total) by mouth every 4 (four) hours as needed for moderate pain. 40 tablet 1   albuterol (PROVENTIL HFA;VENTOLIN HFA) 108 (90 BASE) MCG/ACT inhaler Inhale 2 puffs into the lungs every 6 (six) hours as needed for wheezing or  shortness of breath.        ROS Pertinent positives and negative per HPI, all others reviewed and negative  Physical Exam   BP 109/72   Pulse 65   Temp 98.1 F (36.7 C) (Oral)   Resp 18   Ht 5\' 1"  (1.549 m)   Wt 70.3 kg   LMP 06/14/2020   SpO2 99%   BMI 29.29 kg/m   Patient Vitals for the past 24 hrs:  BP Temp Temp src Pulse Resp SpO2 Height Weight  08/13/20 1606 109/72 98.1 F (36.7 C) Oral 65 18 99 % -- --  08/13/20 1601 -- -- -- -- -- -- 5\' 1"  (1.549 m) 70.3 kg    Physical Exam Vitals reviewed.  Constitutional:      General: She is not in acute distress.    Appearance: She is well-developed. She is not diaphoretic.  Eyes:     General: No scleral icterus. Pulmonary:     Effort: Pulmonary effort is normal. No respiratory distress.  Abdominal:     General: There is no distension.     Palpations: Abdomen is soft.  Skin:    General: Skin is warm and dry.  Neurological:     Mental Status: She is alert.      Coordination: Coordination normal.     Cervical Exam    Bedside Ultrasound Not done  My interpretation: n/a  FHT N/a  Labs Results for orders placed or performed during the hospital encounter of 08/13/20 (from the past 24 hour(s))  Urinalysis, Routine w reflex microscopic Urine, Clean Catch     Status: Abnormal   Collection Time: 08/13/20  4:04 PM  Result Value Ref Range   Color, Urine YELLOW YELLOW   APPearance CLEAR CLEAR   Specific Gravity, Urine 1.015 1.005 - 1.030   pH 5.0 5.0 - 8.0   Glucose, UA NEGATIVE NEGATIVE mg/dL   Hgb urine dipstick SMALL (A) NEGATIVE   Bilirubin Urine NEGATIVE NEGATIVE   Ketones, ur NEGATIVE NEGATIVE mg/dL   Protein, ur NEGATIVE NEGATIVE mg/dL   Nitrite NEGATIVE NEGATIVE   Leukocytes,Ua NEGATIVE NEGATIVE   RBC / HPF 0-5 0 - 5 RBC/hpf   WBC, UA 0-5 0 - 5 WBC/hpf   Bacteria, UA RARE (A) NONE SEEN   Squamous Epithelial / LPF 0-5 0 - 5   Mucus PRESENT   hCG, quantitative, pregnancy     Status: Abnormal   Collection Time: 08/13/20  4:32 PM  Result Value Ref Range   hCG, Beta Chain, Quant, S 5 (H) <5 mIU/mL    Imaging No results found.  MAU Course  Procedures Lab Orders  Urinalysis, Routine w reflex microscopic Urine, Clean Catch  hCG, quantitative, pregnancy  No orders of the defined types were placed in this encounter.  Imaging Orders  No imaging studies ordered today    MDM mild  Assessment and Plan  #PUL #Likely miscarriage Pregnancy of unknown location, but significant downtrend in hcg from 24>5. Discussed with patient this likely represents a miscarriage in process, much less likely ectopic as would expect abnormal rise. Discussed we should get one more additional value as an outpatient, use protection until that time. Will send message to clinic to schedule lab draw sometime next week. Discussed return precautions in detail prior to discharge.   Discharged to home in stable condition, patient chose to leave prior to  receiving paperwork.    08/15/20, MD/MPH 08/13/20 6:40 PM  Allergies as of 08/13/2020  Reactions   Hydrocodone-acetaminophen Nausea Only   Made face feel flushed; felt hot         Medication List     TAKE these medications    acetaminophen 325 MG tablet Commonly known as: Tylenol Take 2 tablets (650 mg total) by mouth every 4 (four) hours as needed for moderate pain.   albuterol 108 (90 Base) MCG/ACT inhaler Commonly known as: VENTOLIN HFA Inhale 2 puffs into the lungs every 6 (six) hours as needed for wheezing or shortness of breath.

## 2020-08-13 NOTE — MAU Note (Signed)
Missed appt for ultrasound and blood work today.  Also states continuing to have pain in right lower abdomen.  Denies VB.

## 2020-09-04 ENCOUNTER — Encounter: Payer: Self-pay | Admitting: *Deleted

## 2020-09-06 ENCOUNTER — Inpatient Hospital Stay (HOSPITAL_COMMUNITY)
Admission: AD | Admit: 2020-09-06 | Discharge: 2020-09-06 | Disposition: A | Discharge status: Home / Self Care | Payer: Medicaid Other | Attending: Obstetrics and Gynecology | Admitting: Obstetrics and Gynecology

## 2020-09-06 ENCOUNTER — Encounter (HOSPITAL_COMMUNITY): Payer: Self-pay | Admitting: Obstetrics and Gynecology

## 2020-09-06 ENCOUNTER — Other Ambulatory Visit: Payer: Self-pay

## 2020-09-06 DIAGNOSIS — Z3202 Encounter for pregnancy test, result negative: Secondary | ICD-10-CM

## 2020-09-06 DIAGNOSIS — R1031 Right lower quadrant pain: Secondary | ICD-10-CM | POA: Diagnosis not present

## 2020-09-06 LAB — HCG, QUANTITATIVE, PREGNANCY: hCG, Beta Chain, Quant, S: <1 m[IU]/mL (ref <5)

## 2020-09-06 LAB — POCT PREGNANCY, URINE: Preg Test, Ur: NEGATIVE

## 2020-09-06 NOTE — MAU Note (Signed)
.  Deborah Brock is a 35 y.o. here in MAU reporting: lower abdominal cramping that started x2 days ago. Stated she had a miscarriage in June. Denies VB. States she had a +HPT x2 days ago.   Pain score: 7 Vitals:   09/06/20 1518  BP: 107/60  Pulse: 87  Resp: 15  Temp: 98.4 F (36.9 C)  SpO2: 100%      Lab orders placed from triage:  UPT

## 2020-09-06 NOTE — MAU Provider Note (Signed)
Event Date/Time   First Provider Initiated Contact with Patient 09/06/20 1656      S Ms. Deborah Brock is a 35 y.o. [id number] patient who presents to MAU today with complaint of RLQ pain and concern for new pregnancy. Patient states her pain is unchanged since onset, which was around 08/09/2020. She is s/p diagnosis of miscarriage in progress in MAU on 08/13/2020. Patient endorses four days of bleeding following that evaluation and a positive home pregnancy test this week.  O BP 107/60 (BP Location: Right Arm)   Pulse 87   Temp 98.4 F (36.9 C) (Oral)   Resp 15   Wt 72.8 kg   SpO2 100%   Breastfeeding Unknown   BMI 30.33 kg/m    Physical Exam Vitals and nursing note reviewed. Exam conducted with a chaperone present.  Constitutional:      Appearance: She is well-developed. She is not ill-appearing.  Cardiovascular:     Rate and Rhythm: Normal rate.     Heart sounds: Normal heart sounds.  Pulmonary:     Effort: Pulmonary effort is normal.  Abdominal:     Palpations: Abdomen is soft.     Tenderness: There is no abdominal tenderness.  Skin:    Capillary Refill: Capillary refill takes less than 2 seconds.  Neurological:     Mental Status: She is alert and oriented to person, place, and time.  Psychiatric:        Mood and Affect: Mood normal.        Behavior: Behavior normal.    A Medical screening exam complete Negative urine and serum pregnancy tests  Results for orders placed or performed during the hospital encounter of 09/06/20 (from the past 24 hour(s))  Pregnancy, urine POC     Status: None   Collection Time: 09/06/20  3:24 PM  Result Value Ref Range   Preg Test, Ur NEGATIVE NEGATIVE  hCG, quantitative, pregnancy     Status: None   Collection Time: 09/06/20  3:51 PM  Result Value Ref Range   hCG, Beta Chain, Quant, S <1 <5 mIU/mL    P Discharge from MAU in stable condition List of options for follow-up given, pt declines transfer to [facility] for evaluation of RLQ  pain Warning signs for worsening condition that would warrant emergency follow-up discussed Patient may return to MAU as needed   Clayton Bibles, [facility] 09/06/2020 5:12 PM

## 2020-09-10 ENCOUNTER — Other Ambulatory Visit: Payer: Medicaid Other

## 2020-09-24 ENCOUNTER — Other Ambulatory Visit: Payer: Medicaid Other

## 2021-04-26 ENCOUNTER — Encounter (HOSPITAL_COMMUNITY): Payer: Self-pay | Admitting: Obstetrics and Gynecology

## 2021-04-26 ENCOUNTER — Other Ambulatory Visit: Payer: Self-pay

## 2021-04-26 ENCOUNTER — Inpatient Hospital Stay (HOSPITAL_COMMUNITY)
Admission: AD | Admit: 2021-04-26 | Discharge: 2021-04-26 | Disposition: A | Discharge status: Home / Self Care | Payer: Medicaid Other | Attending: Obstetrics and Gynecology | Admitting: Obstetrics and Gynecology

## 2021-04-26 DIAGNOSIS — O039 Complete or unspecified spontaneous abortion without complication: Secondary | ICD-10-CM | POA: Diagnosis not present

## 2021-04-26 DIAGNOSIS — Z3202 Encounter for pregnancy test, result negative: Secondary | ICD-10-CM | POA: Insufficient documentation

## 2021-04-26 LAB — POCT PREGNANCY, URINE
Preg Test, Ur: NEGATIVE
Preg Test, Ur: NEGATIVE

## 2021-04-26 LAB — HCG, QUANTITATIVE, PREGNANCY: hCG, Beta Chain, Quant, S: 9 m[IU]/mL — ABNORMAL HIGH (ref <5)

## 2021-04-26 NOTE — MAU Provider Note (Signed)
Event Date/Time  ? First Provider Initiated Contact with Patient 04/26/21 1813   ?  ? ?S ?Ms. Deborah Brock is a 36 y.o. [phone] patient who presents to MAU today with complaint of lower abdominal cramping. Reports a positive urine pregnancy test at home last week. Reports some lower abdominal cramping today. Denies vaginal bleeding.   ? ?O ?BP (!) 145/95   Pulse 77   Temp 98.2 ?F (36.8 ?C)   Resp 18   Ht 5\' 2"  (1.575 m)   LMP 03/08/2021   BMI 29.36 kg/m?  ?Physical Exam ?Vitals and nursing note reviewed.  ?Constitutional:   ?   Appearance: She is well-developed.  ?HENT:  ?   Head: Normocephalic and atraumatic.  ?Pulmonary:  ?   Effort: Pulmonary effort is normal. No respiratory distress.  ?Abdominal:  ?   Tenderness: There is no abdominal tenderness.  ?Neurological:  ?   Mental Status: She is alert.  ?Psychiatric:     ?   Mood and Affect: Mood normal.     ?   Behavior: Behavior normal.  ? ?Results for orders placed or performed during the hospital encounter of 04/26/21 (from the past 24 hour(s))  ?Pregnancy, urine POC     Status: None  ? Collection Time: 04/26/21  6:07 PM  ?Result Value Ref Range  ? Preg Test, Ur NEGATIVE NEGATIVE  ?Pregnancy, urine POC     Status: None  ? Collection Time: 04/26/21  6:25 PM  ?Result Value Ref Range  ? Preg Test, Ur NEGATIVE NEGATIVE  ?hCG, quantitative, pregnancy     Status: Abnormal  ? Collection Time: 04/26/21  6:47 PM  ?Result Value Ref Range  ? hCG, Beta Chain, Quant, S 9 (H) <5 mIU/mL  ? ? ? ?A ?Medical screening exam complete ?1. Negative pregnancy test   ?2. Miscarriage   ? ?-Positive HPT last week. Negative UPT today in MAU. HCG is 9. Dr. 04/28/21 notified of patient's visit & will send message to the office for patient to have follow up.  ?-RH positive ? ?P ?Discharge from MAU in stable condition ?Reviewed reasons to return to MAU ?Follow up with [facility] ? ?[facility], NP ?04/26/2021 8:03 PM  ? ?

## 2021-04-26 NOTE — MAU Note (Incomplete)
.   Deborah Brock is a 36 y.o. at Unknown here in MAU reporting: lower ab pian since yesterday.Had positive HPT earlier this week. Denies any vag bleeding or discharge.  ?LMP: 03/08/21 ?Onset of complaint: yesterday ?Pain score: 7/10 ?Vitals:  ? 04/26/21 1754  ?BP: (!) 145/95  ?Pulse: 77  ?Resp: 18  ?Temp: 98.2 ?F (36.8 ?C)  ?   ?FHT:n/a ?Lab orders placed from triage:   ? ?

## 2021-05-03 ENCOUNTER — Encounter (HOSPITAL_COMMUNITY): Payer: Self-pay | Admitting: Obstetrics and Gynecology

## 2021-05-03 ENCOUNTER — Inpatient Hospital Stay (HOSPITAL_COMMUNITY): Payer: Medicaid Other

## 2021-05-03 ENCOUNTER — Other Ambulatory Visit: Payer: Self-pay

## 2021-05-03 ENCOUNTER — Inpatient Hospital Stay (HOSPITAL_COMMUNITY)
Admission: AD | Admit: 2021-05-03 | Discharge: 2021-05-03 | Disposition: A | Discharge status: Home / Self Care | Payer: Medicaid Other | Attending: Obstetrics and Gynecology | Admitting: Obstetrics and Gynecology

## 2021-05-03 DIAGNOSIS — O26891 Other specified pregnancy related conditions, first trimester: Secondary | ICD-10-CM | POA: Diagnosis present

## 2021-05-03 DIAGNOSIS — O3680X0 Pregnancy with inconclusive fetal viability, not applicable or unspecified: Secondary | ICD-10-CM | POA: Diagnosis not present

## 2021-05-03 DIAGNOSIS — Z3A01 Less than 8 weeks gestation of pregnancy: Secondary | ICD-10-CM | POA: Insufficient documentation

## 2021-05-03 LAB — URINALYSIS, ROUTINE W REFLEX MICROSCOPIC
Bilirubin Urine: NEGATIVE
Glucose, UA: NEGATIVE mg/dL
Hgb urine dipstick: NEGATIVE
Ketones, ur: NEGATIVE mg/dL
Leukocytes,Ua: NEGATIVE
Nitrite: NEGATIVE
Protein, ur: NEGATIVE mg/dL
Specific Gravity, Urine: 1.017 (ref 1.005–1.030)
pH: 7 (ref 5.0–8.0)

## 2021-05-03 LAB — HCG, QUANTITATIVE, PREGNANCY: hCG, Beta Chain, Quant, S: 525 m[IU]/mL — ABNORMAL HIGH (ref <5)

## 2021-05-03 LAB — CBC
HCT: 43.3 % (ref 36.0–46.0)
Hemoglobin: 14.4 g/dL (ref 12.0–15.0)
MCH: 28 pg (ref 26.0–34.0)
MCHC: 33.3 g/dL (ref 30.0–36.0)
MCV: 84.1 fL (ref 80.0–100.0)
Platelets: 397 10*3/uL (ref 150–400)
RBC: 5.15 MIL/uL — ABNORMAL HIGH (ref 3.87–5.11)
RDW: 12.8 % (ref 11.5–15.5)
WBC: 12.5 10*3/uL — ABNORMAL HIGH (ref 4.0–10.5)
nRBC: 0 % (ref 0.0–0.2)

## 2021-05-03 LAB — POCT PREGNANCY, URINE: Preg Test, Ur: POSITIVE — AB

## 2021-05-03 NOTE — MAU Note (Signed)
Deborah Brock is a 36 y.o. at Unknown here in MAU reporting: was here last wk, +blood preg test, they thought her levels would go down.  Still having +preg tests and having pains in RLQ- which is why she came in. No bleeding ?LMP: 2/23 ?Onset of complaint: wk ?Pain score: 7/10 ?Vitals:  ? 05/03/21 1550  ?BP: 133/84  ?Pulse: 78  ?Resp: 20  ?Temp: 98.3 ?F (36.8 ?C)  ?SpO2: 100%  ?   ?Lab orders placed from triage:  urine/UPT ? ?

## 2021-05-03 NOTE — MAU Provider Note (Signed)
?History  ?  ? ?CSN: [id number] ? ?Arrival date and time: 05/03/21 1515 ? ? Event Date/Time  ? First Provider Initiated Contact with Patient 05/03/21 1846   ?  ? ?Chief Complaint  ?Patient presents with  ? Abdominal Pain  ? ?HPI ?This is a 36yo [id number] at [redacted]w[redacted]d by LMP who presents with right-sided abdominal pain.  This has been going on for a week.  She was here last week and had a positive hCG at 9.  She was still having positive pregnancy test and right lower quadrant pain which is why she presented to the MAU today.  Pain is 6-7 out of 10 ? ?OB History   ? ? Gravida  ?9  ? Para  ?3  ? Term  ?3  ? Preterm  ?0  ? AB  ?5  ? Living  ?3  ?  ? ? SAB  ?2  ? IAB  ?2  ? Ectopic  ?1  ? Multiple  ?0  ? Live Births  ?1  ?   ?  ?  ? ? ?Past Medical History:  ?Diagnosis Date  ? Hypertension   ? ? ?Past Surgical History:  ?Procedure Laterality Date  ? BREAST SURGERY    ? Augmentation 10/2013  ? DIAGNOSTIC LAPAROSCOPY WITH REMOVAL OF ECTOPIC PREGNANCY Right 01/07/2020  ? Procedure: DIAGNOSTIC LAPAROSCOPY WITH REMOVAL OF ECTOPIC PREGNANCY;  Surgeon: Sherlyn Hay, DO;  Location: [place];  Service: Gynecology;  Laterality: Right;  ? THERAPEUTIC ABORTION    ? May 2016  ? TONSILLECTOMY    ? ? ?Family History  ?Problem Relation Age of Onset  ? Hypertension Mother   ? Diabetes Mother   ? ? ?Social History  ? ?Tobacco Use  ? Smoking status: Every Day  ? Smokeless tobacco: Never  ?Substance Use Topics  ? Alcohol use: No  ?  Comment: socially  ? Drug use: Yes  ?  Types: Marijuana  ?  Comment: every other day  ? ? ?Allergies:  ?Allergies  ?Allergen Reactions  ? Hydrocodone-Acetaminophen Nausea Only  ?  Made face feel flushed; felt hot   ? ? ?Medications Prior to Admission  ?Medication Sig Dispense Refill Last Dose  ? acetaminophen (TYLENOL) 325 MG tablet Take 2 tablets (650 mg total) by mouth every 4 (four) hours as needed for moderate pain. 40 tablet 1   ? albuterol (PROVENTIL HFA;VENTOLIN HFA) 108 (90 BASE) MCG/ACT inhaler Inhale 2  puffs into the lungs every 6 (six) hours as needed for wheezing or shortness of breath.      ? ? ?Review of Systems ?Physical Exam  ? ?Blood pressure 139/89, pulse 80, temperature 98.3 ?F (36.8 ?C), temperature source Oral, resp. rate 16, height 5\' 2"  (1.575 m), weight 68 kg, last menstrual period 04/08/2021, SpO2 100 %, unknown if currently breastfeeding. ? ?Physical Exam ?Vitals and nursing note reviewed.  ?Constitutional:   ?   Appearance: She is well-developed.  ?Abdominal:  ?   Tenderness: There is no abdominal tenderness. There is no guarding or rebound.  ?Skin: ?   General: Skin is warm and dry.  ?Neurological:  ?   Mental Status: She is alert.  ? ?Results for orders placed or performed during the hospital encounter of 05/03/21 (from the past 24 hour(s))  ?Pregnancy, urine POC     Status: Abnormal  ? Collection Time: 05/03/21  3:39 PM  ?Result Value Ref Range  ? Preg Test, Ur POSITIVE (A) NEGATIVE  ?Urinalysis, Routine w reflex microscopic  Urine, Clean Catch     Status: None  ? Collection Time: 05/03/21  3:40 PM  ?Result Value Ref Range  ? Color, Urine YELLOW YELLOW  ? APPearance CLEAR CLEAR  ? Specific Gravity, Urine 1.017 1.005 - 1.030  ? pH 7.0 5.0 - 8.0  ? Glucose, UA NEGATIVE NEGATIVE mg/dL  ? Hgb urine dipstick NEGATIVE NEGATIVE  ? Bilirubin Urine NEGATIVE NEGATIVE  ? Ketones, ur NEGATIVE NEGATIVE mg/dL  ? Protein, ur NEGATIVE NEGATIVE mg/dL  ? Nitrite NEGATIVE NEGATIVE  ? Leukocytes,Ua NEGATIVE NEGATIVE  ?CBC     Status: Abnormal  ? Collection Time: 05/03/21  4:22 PM  ?Result Value Ref Range  ? WBC 12.5 (H) 4.0 - 10.5 K/uL  ? RBC 5.15 (H) 3.87 - 5.11 MIL/uL  ? Hemoglobin 14.4 12.0 - 15.0 g/dL  ? HCT 43.3 36.0 - 46.0 %  ? MCV 84.1 80.0 - 100.0 fL  ? MCH 28.0 26.0 - 34.0 pg  ? MCHC 33.3 30.0 - 36.0 g/dL  ? RDW 12.8 11.5 - 15.5 %  ? Platelets 397 150 - 400 K/uL  ? nRBC 0.0 0.0 - 0.2 %  ?hCG, quantitative, pregnancy     Status: Abnormal  ? Collection Time: 05/03/21  4:22 PM  ?Result Value Ref Range  ? hCG,  Beta Chain, Quant, S 525 (H) <5 mIU/mL  ? ?US OB LESS THAN 14 WEEKS WITH OB TRANSVAGINAL ? ?Result Date: 05/03/2021 ?CLINICAL DATA:  Right lower quadrant abdominal pain. EXAM: OBSTETRIC <14 WK [place] AND TRANSVAGINAL OB US TECHNIQUE: Both transabdominal and transvaginal ultrasound examinations were performed for complete evaluation of the gestation as well as the maternal uterus, adnexal regions, and pelvic cul-de-sac. Transvaginal technique was performed to assess early pregnancy. COMPARISON:  August 10, 2020. FINDINGS: Intrauterine gestational sac: None Yolk sac:  Not Visualized. Embryo:  Not Visualized. Cardiac Activity: Not Visualized. Subchorionic hemorrhage:  None visualized. Maternal uterus/adnexae: Right ovary is unremarkable. Small amount of free fluid is noted which most likely is physiologic. Possible corpus luteum cyst seen in left ovary. IMPRESSION: No intrauterine gestational sac, yolk sac, fetal pole, or cardiac activity visualized. Differential considerations include intrauterine gestation too early to be sonographically visualized, spontaneous abortion, or ectopic pregnancy. Consider follow-up ultrasound in 14 days and serial quantitative beta HCG follow-up. Electronically Signed   By: Marijo Conception M.D.   On: 05/03/2021 17:45   ? ?MAU Course  ?Procedures ? ?MDM ? ? ?Assessment and Plan  ? ?1. Pregnancy of unknown anatomic location   ? ?Discussed patient with Dr Terri Piedra.  We will have patient follow-up with them in approximately 48 hours for repeat hCG. ?Discharge to home.  Return precautions given.  ? ?Truett Mainland ?05/03/2021, 6:53 PM  ?

## 2021-05-17 ENCOUNTER — Inpatient Hospital Stay (HOSPITAL_COMMUNITY): Payer: Medicaid Other

## 2021-05-17 ENCOUNTER — Inpatient Hospital Stay (HOSPITAL_COMMUNITY)
Admission: AD | Admit: 2021-05-17 | Discharge: 2021-05-17 | Disposition: A | Discharge status: Home / Self Care | Payer: Medicaid Other | Attending: Obstetrics and Gynecology | Admitting: Obstetrics and Gynecology

## 2021-05-17 DIAGNOSIS — O26891 Other specified pregnancy related conditions, first trimester: Secondary | ICD-10-CM | POA: Insufficient documentation

## 2021-05-17 DIAGNOSIS — Z3A01 Less than 8 weeks gestation of pregnancy: Secondary | ICD-10-CM | POA: Diagnosis not present

## 2021-05-17 DIAGNOSIS — R109 Unspecified abdominal pain: Secondary | ICD-10-CM | POA: Diagnosis present

## 2021-05-17 DIAGNOSIS — Z3490 Encounter for supervision of normal pregnancy, unspecified, unspecified trimester: Secondary | ICD-10-CM

## 2021-05-17 DIAGNOSIS — O09291 Supervision of pregnancy with other poor reproductive or obstetric history, first trimester: Secondary | ICD-10-CM | POA: Diagnosis not present

## 2021-05-17 LAB — WET PREP, GENITAL
Clue Cells Wet Prep HPF POC: NONE SEEN
Sperm: NONE SEEN
Trich, Wet Prep: NONE SEEN
WBC, Wet Prep HPF POC: <10 (ref <10)
Yeast Wet Prep HPF POC: NONE SEEN

## 2021-05-17 LAB — COMPREHENSIVE METABOLIC PANEL
ALT: 12 U/L (ref 0–44)
AST: 14 U/L — ABNORMAL LOW (ref 15–41)
Albumin: 3.4 g/dL — ABNORMAL LOW (ref 3.5–5.0)
Alkaline Phosphatase: 74 U/L (ref 38–126)
Anion gap: 6 (ref 5–15)
BUN: <5 mg/dL — ABNORMAL LOW (ref 6–20)
CO2: 27 mmol/L (ref 22–32)
Calcium: 8.6 mg/dL — ABNORMAL LOW (ref 8.9–10.3)
Chloride: 105 mmol/L (ref 98–111)
Creatinine, Ser: 0.64 mg/dL (ref 0.44–1.00)
GFR, Estimated: >60 mL/min (ref >60)
Glucose, Bld: 98 mg/dL (ref 70–99)
Potassium: 3.5 mmol/L (ref 3.5–5.1)
Sodium: 138 mmol/L (ref 135–145)
Total Bilirubin: 0.5 mg/dL (ref 0.3–1.2)
Total Protein: 6.1 g/dL — ABNORMAL LOW (ref 6.5–8.1)

## 2021-05-17 LAB — CBC
HCT: 39.9 % (ref 36.0–46.0)
Hemoglobin: 13 g/dL (ref 12.0–15.0)
MCH: 27.5 pg (ref 26.0–34.0)
MCHC: 32.6 g/dL (ref 30.0–36.0)
MCV: 84.5 fL (ref 80.0–100.0)
Platelets: 357 10*3/uL (ref 150–400)
RBC: 4.72 MIL/uL (ref 3.87–5.11)
RDW: 13 % (ref 11.5–15.5)
WBC: 11.8 10*3/uL — ABNORMAL HIGH (ref 4.0–10.5)
nRBC: 0 % (ref 0.0–0.2)

## 2021-05-17 LAB — HCG, QUANTITATIVE, PREGNANCY: hCG, Beta Chain, Quant, S: 13620 m[IU]/mL — ABNORMAL HIGH (ref <5)

## 2021-05-17 MED ORDER — ACETAMINOPHEN 500 MG PO TABS: 1000.0000 mg | ORAL_TABLET | Freq: Once | ORAL | Status: DC

## 2021-05-17 NOTE — MAU Provider Note (Signed)
?History  ?  ? ?CSN: [id number] ? ?Arrival date and time: 05/17/21 1725 ? ? Event Date/Time  ? First Provider Initiated Contact with Patient 05/17/21 1829   ?  ? ?Chief Complaint  ?Patient presents with  ? Abdominal Pain  ? ?Deborah Brock is a 36 y.o. [id number] at 5227w4d by approximate LMP who [facility].  She presents today for Abdominal Pain.  She states she is experiencing intermittent right-sided abdominal pain that she describes as "sharp pains goes and comes."  She states pain only last "few seconds."  She endorses that this pain was present 2 weeks ago, felt that it eased up.  Patient rates the pain a 6/10 has not identified any aggravating or relieving factors.  She states she had been seen at her primary office but has not had an ultrasound.  Patient expresses concern with return of pain due to history of ectopic pregnancy.  Patient does endorse ultrasound scheduled at her primary office for April 17th. ? ? ?OB History   ? ? Gravida  ?9  ? Para  ?3  ? Term  ?3  ? Preterm  ?0  ? AB  ?5  ? Living  ?3  ?  ? ? SAB  ?2  ? IAB  ?2  ? Ectopic  ?1  ? Multiple  ?0  ? Live Births  ?1  ?   ?  ?  ? ? ?Past Medical History:  ?Diagnosis Date  ? Hypertension   ? ? ?Past Surgical History:  ?Procedure Laterality Date  ? BREAST SURGERY    ? Augmentation 10/2013  ? DIAGNOSTIC LAPAROSCOPY WITH REMOVAL OF ECTOPIC PREGNANCY Right 01/07/2020  ? Procedure: DIAGNOSTIC LAPAROSCOPY WITH REMOVAL OF ECTOPIC PREGNANCY;  Surgeon: Edwinna AreolaBanga, Cecilia Worema, DO;  Location: [facility] OR;  Service: Gynecology;  Laterality: Right;  ? THERAPEUTIC ABORTION    ? May 2016  ? TONSILLECTOMY    ? ? ?Family History  ?Problem Relation Age of Onset  ? Hypertension Mother   ? Diabetes Mother   ? ? ?Social History  ? ?Tobacco Use  ? Smoking status: Every Day  ? Smokeless tobacco: Never  ?Substance Use Topics  ? Alcohol use: No  ?  Comment: socially  ? Drug use: Yes  ?  Types: Marijuana  ?  Comment: every other day  ? ? ?Allergies:  ?Allergies   ?Allergen Reactions  ? Hydrocodone-Acetaminophen Nausea Only  ?  Made face feel flushed; felt hot   ? ? ?Medications Prior to Admission  ?Medication Sig Dispense Refill Last Dose  ? acetaminophen (TYLENOL) 325 MG tablet Take 2 tablets (650 mg total) by mouth every 4 (four) hours as needed for moderate pain. 40 tablet 1   ? albuterol (PROVENTIL HFA;VENTOLIN HFA) 108 (90 BASE) MCG/ACT inhaler Inhale 2 puffs into the lungs every 6 (six) hours as needed for wheezing or shortness of breath.      ? ? ?Review of Systems  ?Gastrointestinal:  Positive for abdominal pain. Negative for constipation, diarrhea, nausea and vomiting.  ?Genitourinary:  Negative for difficulty urinating, dysuria, vaginal bleeding and vaginal discharge.  ?Neurological:  Negative for dizziness, light-headedness and headaches.  ?Physical Exam  ? ?Blood pressure 120/77, pulse 74, temperature 98.4 ?F (36.9 ?C), temperature source Oral, resp. rate 16, height 5\' 2"  (1.575 m), weight 69.5 kg, last menstrual period 04/08/2021, SpO2 100 %, unknown if currently breastfeeding. ? ?Physical Exam ?Constitutional:   ?   Appearance: Normal appearance. She is well-developed.  ?HENT:  ?  Head: Normocephalic and atraumatic.  ?Eyes:  ?   Conjunctiva/sclera: Conjunctivae normal.  ?Cardiovascular:  ?   Rate and Rhythm: Normal rate and regular rhythm.  ?   Heart sounds: Normal heart sounds.  ?Pulmonary:  ?   Effort: Pulmonary effort is normal. No respiratory distress.  ?   Breath sounds: Normal breath sounds.  ?Abdominal:  ?   General: Bowel sounds are normal.  ?   Tenderness: There is abdominal tenderness in the right upper quadrant and right lower quadrant.  ?Musculoskeletal:     ?   General: Normal range of motion.  ?   Cervical back: Normal range of motion.  ?Skin: ?   General: Skin is warm and dry.  ?Neurological:  ?   Mental Status: She is alert and oriented to person, place, and time.  ?Psychiatric:     ?   Mood and Affect: Mood normal.     ?   Behavior: Behavior  normal.  ? ? ?MAU Course  ?Procedures ?Results for orders placed or performed during the hospital encounter of 05/17/21 (from the past 24 hour(s))  ?CBC     Status: Abnormal  ? Collection Time: 05/17/21  5:25 PM  ?Result Value Ref Range  ? WBC 11.8 (H) 4.0 - 10.5 K/uL  ? RBC 4.72 3.87 - 5.11 MIL/uL  ? Hemoglobin 13.0 12.0 - 15.0 g/dL  ? HCT 39.9 36.0 - 46.0 %  ? MCV 84.5 80.0 - 100.0 fL  ? MCH 27.5 26.0 - 34.0 pg  ? MCHC 32.6 30.0 - 36.0 g/dL  ? RDW 13.0 11.5 - 15.5 %  ? Platelets 357 150 - 400 K/uL  ? nRBC 0.0 0.0 - 0.2 %  ?Comprehensive metabolic panel     Status: Abnormal  ? Collection Time: 05/17/21  5:25 PM  ?Result Value Ref Range  ? Sodium 138 135 - 145 mmol/L  ? Potassium 3.5 3.5 - 5.1 mmol/L  ? Chloride 105 98 - 111 mmol/L  ? CO2 27 22 - 32 mmol/L  ? Glucose, Bld 98 70 - 99 mg/dL  ? BUN <5 (L) 6 - 20 mg/dL  ? Creatinine, Ser 0.64 0.44 - 1.00 mg/dL  ? Calcium 8.6 (L) 8.9 - 10.3 mg/dL  ? Total Protein 6.1 (L) 6.5 - 8.1 g/dL  ? Albumin 3.4 (L) 3.5 - 5.0 g/dL  ? AST 14 (L) 15 - 41 U/L  ? ALT 12 0 - 44 U/L  ? Alkaline Phosphatase 74 38 - 126 U/L  ? Total Bilirubin 0.5 0.3 - 1.2 mg/dL  ? GFR, Estimated >60 >60 mL/min  ? Anion gap 6 5 - 15  ?Wet prep, genital     Status: None  ? Collection Time: 05/17/21  6:41 PM  ? Specimen: PATH Cytology Cervicovaginal Ancillary Only  ?Result Value Ref Range  ? Yeast Wet Prep HPF POC NONE SEEN NONE SEEN  ? Trich, Wet Prep NONE SEEN NONE SEEN  ? Clue Cells Wet Prep HPF POC NONE SEEN NONE SEEN  ? WBC, Wet Prep HPF POC <10 <10  ? Sperm NONE SEEN   ? ?US OB Transvaginal ? ?Result Date: 05/17/2021 ?CLINICAL DATA:  Lower abdominal pain EXAM: TRANSVAGINAL OB ULTRASOUND TECHNIQUE: Transvaginal ultrasound was performed for complete evaluation of the gestation as well as the maternal uterus, adnexal regions, and pelvic cul-de-sac. COMPARISON:  05/03/2021 FINDINGS: Intrauterine gestational sac: Single intrauterine gestational sac. Yolk sac:  Probable yolk sac. Embryo:  Not seen Cardiac  Activity: Not seen MSD: 8.3 mm  5 w   4 d Subchorionic hemorrhage:  None visualized. Maternal uterus/adnexae: Ovaries are within normal limits. Right ovary measures 1.9 x 2.2 x 2.1 cm. Left ovary measures 2.9 x 1.9 x 2.7 cm. Trace free fluid. IMPRESSION: 1. Interval single intrauterine gestational sac and probable yolk sac but no definitive embryo. Suggest follow-up ultrasound in 10-14 days to confirm viability. 2. Trace free fluid. Electronically Signed   By: Jasmine Pang M.D.   On: 05/17/2021 19:27   ? ?MDM ?Pelvic Exam; Wet Prep and GC/CT ?Labs: UA, UPT, CBC, hCG ?Ultrasound ?Assessment and Plan  ?36 year old  ?[id number] at 5.4 weeks ?Abdominal Pain ?H/O Ectopic Pregnancy ? ?-POC Reviewed. ?-Exam performed. ?-Informed that labs would be recollected. ?-Patient agreeable to cultures including STD testing.  Patient instructed on self-swab. ?-Patient offered pain medication and drove self so tylenol ordered. ?-Discussed sending for repeat US to assess pregnancy location. ?-Patient without questions. ?-Will await results.  ? ?Cherre Robins ?05/17/2021, 6:49 PM  ? ?Reassessment (7:48 PM) ? ?-Patient requests discharge. ?-Provider to bedside to review results. ?-Informed that US findings c/w LMP and no embyro yet seen. ?-Reassured that ectopic pregnancy unlikely as IUGS and likely YS noted in [place]. ?-Instructed to follow up, as scheduled, for repeat US on April 17th.  ?-Patient verbalizes understanding and without questions. ?-Patient has not received and now declines pain medication. ?-Encouraged to call primary office or return to MAU if symptoms worsen or with the onset of new symptoms. ?-Discharged to home in stable condition. ? ?Cherre Robins MSN, CNM ?Systems developer, [facility] [employer] ? ? ?

## 2021-05-17 NOTE — MAU Note (Signed)
Deborah Brock is a 36 y.o. at [redacted]w[redacted]d here in MAU reporting: hx of ectopic, believes she is about 7 wks. Was doing ok, then pain started back a couple days ago.  (Plan per notes was for repeat US in ~2 wks- that would be now).  No bleeding. Sharp pains that come and go in RLQ. Has had blood work in office, hormone level has been rising.  ?Onset of complaint: 2days ago ?Pain score: 6 ?Vitals:  ? 05/17/21 1810  ?BP: 120/77  ?Pulse: 74  ?Resp: 16  ?Temp: 98.4 ?F (36.9 ?C)  ?SpO2: 100%  ?   ? ?Lab orders placed from triage:  none ?

## 2021-05-18 LAB — GC/CHLAMYDIA PROBE AMP (~~LOC~~) NOT AT ARMC
Chlamydia: NEGATIVE
Comment: NEGATIVE
Comment: NORMAL
Neisseria Gonorrhea: NEGATIVE

## 2021-06-17 ENCOUNTER — Telehealth: Payer: Self-pay

## 2021-06-17 DIAGNOSIS — O3680X0 Pregnancy with inconclusive fetal viability, not applicable or unspecified: Secondary | ICD-10-CM

## 2021-06-17 NOTE — Telephone Encounter (Signed)
VM received from [facility] at [facility]. Referring patient for pregnancy that does not seem to be progressing. Has had 2 [place] with [facility] that shows gestational sac, but does not show fetal pole or yolk sac. Pt has also been seen at MAU on 05/17/21, last US showed gestational sac measuring 5w 4d and probable yolk sac. Follow up US scheduled in our office for 06/22/21 at 8 AM.  ? ?Called pt; VM box is full. MyChart message sent.  ?

## 2021-06-17 NOTE — Addendum Note (Signed)
Addended by: Louisa Second E on: 06/17/2021 03:53 PM ? ? Modules accepted: Orders ? ?

## 2021-06-20 ENCOUNTER — Inpatient Hospital Stay (HOSPITAL_COMMUNITY): Payer: Medicaid Other

## 2021-06-20 ENCOUNTER — Encounter: Payer: Self-pay | Admitting: Student

## 2021-06-20 ENCOUNTER — Other Ambulatory Visit: Payer: Self-pay

## 2021-06-20 ENCOUNTER — Inpatient Hospital Stay (HOSPITAL_COMMUNITY)
Admission: AD | Admit: 2021-06-20 | Discharge: 2021-06-20 | Discharge status: Against Medical Advice | Payer: Medicaid Other | Attending: Obstetrics and Gynecology | Admitting: Obstetrics and Gynecology

## 2021-06-20 DIAGNOSIS — Z5321 Procedure and treatment not carried out due to patient leaving prior to being seen by health care provider: Secondary | ICD-10-CM | POA: Insufficient documentation

## 2021-06-20 DIAGNOSIS — O039 Complete or unspecified spontaneous abortion without complication: Secondary | ICD-10-CM

## 2021-06-20 NOTE — MAU Provider Note (Signed)
Patient Deborah Brock is a 36 y.o. [id number] ? At [redacted]w[redacted]d here with concern about baby not growing. She denies strong abdominal pain or vaginal bleeding. She was supposed to start prenatal care at [facility] but could not because of her insurance. ? ? ?History  ?  ? ?CSN: [id number] ? ?Arrival date and time: 06/20/21 0439 ? ? None  ?  ? ?Chief Complaint  ?Patient presents with  ? Follow-up  ? ?HPI ?Patient presented at 0500 due to concern about baby's growth. Patient states she [facility] and they told her that she wasn't measuring correctly and to have follow up at [facility]. She was not able to come until this morning. She has had miscarriages, D&C and ectopic pregnancy in the past so she became very worried when she felt some slight abdominal cramping.  ?OB History   ? ? Gravida  ?9  ? Para  ?3  ? Term  ?3  ? Preterm  ?0  ? AB  ?5  ? Living  ?3  ?  ? ? SAB  ?2  ? IAB  ?2  ? Ectopic  ?1  ? Multiple  ?0  ? Live Births  ?1  ?   ?  ?  ? ? ?Past Medical History:  ?Diagnosis Date  ? Hypertension   ? ? ?Past Surgical History:  ?Procedure Laterality Date  ? BREAST SURGERY    ? Augmentation 10/2013  ? DIAGNOSTIC LAPAROSCOPY WITH REMOVAL OF ECTOPIC PREGNANCY Right 01/07/2020  ? Procedure: DIAGNOSTIC LAPAROSCOPY WITH REMOVAL OF ECTOPIC PREGNANCY;  Surgeon: Edwinna Areola, DO;  Location: [facility] OR;  Service: Gynecology;  Laterality: Right;  ? THERAPEUTIC ABORTION    ? May 2016  ? TONSILLECTOMY    ? ? ?Family History  ?Problem Relation Age of Onset  ? Hypertension Mother   ? Diabetes Mother   ? ? ?Social History  ? ?Tobacco Use  ? Smoking status: Every Day  ? Smokeless tobacco: Never  ?Substance Use Topics  ? Alcohol use: No  ?  Comment: socially  ? Drug use: Yes  ?  Types: Marijuana  ?  Comment: every other day  ? ? ?Allergies:  ?Allergies  ?Allergen Reactions  ? Hydrocodone-Acetaminophen Nausea Only  ?  Made face feel flushed; felt hot   ? ? ?Medications Prior to Admission  ?Medication Sig Dispense Refill  Last Dose  ? acetaminophen (TYLENOL) 325 MG tablet Take 2 tablets (650 mg total) by mouth every 4 (four) hours as needed for moderate pain. 40 tablet 1   ? ? ?Review of Systems  ?Constitutional: Negative.   ?HENT: Negative.    ?Respiratory: Negative.    ?Cardiovascular: Negative.   ?Genitourinary: Negative.   ?Neurological: Negative.   ?Physical Exam  ? ?Blood pressure 128/89, pulse 95, temperature 99.3 ?F (37.4 ?C), temperature source Oral, resp. rate 15, last menstrual period 04/08/2021, SpO2 99 %, unknown if currently breastfeeding. ? ?Physical Exam ?Pulmonary:  ?   Effort: Pulmonary effort is normal.  ?Musculoskeletal:  ?   Cervical back: Normal range of motion.  ?Skin: ?   General: Skin is warm.  ?Neurological:  ?   General: No focal deficit present.  ?   Mental Status: She is alert.  ?Psychiatric:     ?   Mood and Affect: Mood normal.  ? ? ?MAU Course  ?Procedures ? ?MDM ?Due to high acuity in MAU, patient was seen and evaluated initially in triage. Patient did not know  that she had an appt at [place] on Tuesday and then reported that she "absolutely cannot make that appt due to no car, boyfriend at work and eviction she is facing". She is adamant she cannot Benedetto Goad or get a ride. I attempted a BSUS and was unable to determine viability (had previous US that showed IUP in early April).  ? ?She was sent to [place] before being roomed, US showed nonviable pregnancy. Patient was not sure about D and C vs. Cytotec, she was unable to decide and was not sure what to do about her son and transporation. She stated that "she can get a ride to the [facility]"  and asks me to schedule it for her. I replied that I cannot do that, but that she can look at her options and I can send a message and she will be notified early this week. I asked her to wait in the family room while I tended to other patients; when I returned patient had left.  ? ?TC to patient to check on her at 0730, no answer and brief HIPAA compliant vm was left.  ?Will send  my Chart message.  ? ?Assessment and Plan  ?-left AMA and without signing papers ? ?Charlesetta Garibaldi [place] ?06/20/2021, 5:30 AM  ?

## 2021-06-20 NOTE — MAU Note (Signed)
.  Deborah Brock is a 36 y.o. at [redacted]w[redacted]d here in MAU reporting: that she was told to come here for follow up from [facility]. They told her it did not look like the preg was progressing.  ?Onset of complaint: na ?Pain score: 0/10 ?Vitals:  ? 06/20/21 0456  ?BP: 128/89  ?Pulse: 95  ?Resp: 15  ?Temp: 99.3 ?F (37.4 ?C)  ?SpO2: 99%  ?   ?FHT:n/a ?Lab orders placed from triage:   ? ?

## 2021-06-20 NOTE — MAU Note (Signed)
CNM attempted to go speak with pt, pt not in family room or in MAU lobby. Assumed pt eloped. CNM will attempt to contact the patient.  ?

## 2021-06-22 ENCOUNTER — Other Ambulatory Visit: Payer: Medicaid Other

## 2021-06-26 ENCOUNTER — Encounter (HOSPITAL_COMMUNITY): Payer: Self-pay | Admitting: Obstetrics and Gynecology

## 2021-06-26 ENCOUNTER — Inpatient Hospital Stay (HOSPITAL_COMMUNITY)
Admission: AD | Admit: 2021-06-26 | Discharge: 2021-06-26 | Disposition: A | Discharge status: Home / Self Care | Payer: Medicaid Other | Attending: Obstetrics and Gynecology | Admitting: Obstetrics and Gynecology

## 2021-06-26 DIAGNOSIS — O039 Complete or unspecified spontaneous abortion without complication: Secondary | ICD-10-CM

## 2021-06-26 LAB — COMPREHENSIVE METABOLIC PANEL
ALT: 9 U/L (ref 0–44)
AST: 16 U/L (ref 15–41)
Albumin: 3.7 g/dL (ref 3.5–5.0)
Alkaline Phosphatase: 67 U/L (ref 38–126)
Anion gap: 7 (ref 5–15)
BUN: <5 mg/dL — ABNORMAL LOW (ref 6–20)
CO2: 24 mmol/L (ref 22–32)
Calcium: 9 mg/dL (ref 8.9–10.3)
Chloride: 107 mmol/L (ref 98–111)
Creatinine, Ser: 0.68 mg/dL (ref 0.44–1.00)
GFR, Estimated: >60 mL/min (ref >60)
Glucose, Bld: 89 mg/dL (ref 70–99)
Potassium: 3.5 mmol/L (ref 3.5–5.1)
Sodium: 138 mmol/L (ref 135–145)
Total Bilirubin: 1.2 mg/dL (ref 0.3–1.2)
Total Protein: 6.5 g/dL (ref 6.5–8.1)

## 2021-06-26 LAB — CBC
HCT: 43 % (ref 36.0–46.0)
Hemoglobin: 14.7 g/dL (ref 12.0–15.0)
MCH: 28.6 pg (ref 26.0–34.0)
MCHC: 34.2 g/dL (ref 30.0–36.0)
MCV: 83.7 fL (ref 80.0–100.0)
Platelets: 369 10*3/uL (ref 150–400)
RBC: 5.14 MIL/uL — ABNORMAL HIGH (ref 3.87–5.11)
RDW: 13 % (ref 11.5–15.5)
WBC: 11.8 10*3/uL — ABNORMAL HIGH (ref 4.0–10.5)
nRBC: 0 % (ref 0.0–0.2)

## 2021-06-26 NOTE — Discharge Instructions (Signed)
FACTS YOU SHOULD KNOW ? ?WHAT IS AN EARLY PREGNANCY FAILURE? ?Once the egg is fertilized with the sperm and begins to develop, it attaches to the lining of the uterus. This early pregnancy tissue may not develop into an embryo (the beginning stage of a baby). Sometimes an embryo does develop but does not continue to grow. These problems can be seen on ultrasound.  ? ?MANAGEMNT OF EARLY PREGNANCY FAILURE: ?About 4 out of 100 (0.25%) women will have a pregnancy loss in her lifetime.  One in five pregnancies is found to be an early pregnancy failure.  There are 3 ways to care for an early pregnancy failure:   ?Surgery, (2) Medicine, (3) Waiting for you to pass the pregnancy on your own. ?The decision as to how to proceed after being diagnosed with and early pregnancy failure is an individual one.  The decision can be made only after appropriate counseling.  You need to weigh the pros and cons of the 3 choices. Then you can make the choice that works for you. ?SURGERY (D&E) ?Procedure over in 1 day ?Requires being put to sleep ?Bleeding may be light ?Possible problems during surgery, including injury to womb(uterus) ?Care provider has more control ?Medicine ([place]) ?The complete procedure may take days to weeks ?No Surgery ?Bleeding may be heavy at times ?There may be drug side effects ?Patient has more control ?Waiting ?You may choose to wait, in which case your own body may complete the passing of the abnormal early pregnancy on its own in about 2-4 weeks ?Your bleeding may be heavy at times ?There is a small possibility that you may need surgery if the bleeding is too much or not all of the pregnancy has passed. ?CYTOTEC MANAGEMENT ?Prostaglandins (cytotec) are the most widely used drug for this purpose. They cause the uterus to cramp and contract. You will place the medicine yourself inside your vagina in the privacy of your home. Empting of the uterus should occur within 3 days but the process may continue for  several weeks. The bleeding may seem heavy at times. ?POSSIBLE SIDE EFFECTS FROM CYTOTEC ?Nausea   Vomiting ?Diarrhea Fever ?Chills  Hot Flashes ?Side effects  from the process of the early pregnancy failure include: ?Cramping  Bleeding ?Headaches  Dizziness ?RISKS: ?This is a low risk procedure. Less than 1 in 100 women has a complication. An incomplete passage of the early pregnancy may occur. Also, Hemorrhage (heavy bleeding) could happen.  Rarely the pregnancy will not be passed completely. Excessively heavy bleeding may occur.  Your doctor may need to perform surgery to empty the uterus (D&E). ?Afterwards: ?Everybody will feel differently after the early pregnancy completion. You may have soreness or cramps for a day or two. You may have soreness or cramps for day or two.  You may have light bleeding for up to 2 weeks. You may be as active as you feel like being. ?If you have any of the following problems you may call Maternity Admissions Unit at [phone]. ?If you have pain that does not get better  with pain medication ?Bleeding that soaks through 2 thick full-sized sanitary pads in an hour for two hours  ?Cramps that last longer than 2 days ?Foul smelling discharge ?Fever above 100.4 degrees F ?Even if you do not have any of these symptoms, you should have a follow-up exam to make sure you are healing properly. This appointment will be made for you before you leave the hospital. Your next normal period will  start again in 4-6 week after the loss. You can get pregnant soon after the loss, so use birth control right away. Finally: Make sure all your questions are answered before during and after any procedure. Follow up with medical care and family planning methods.     

## 2021-06-26 NOTE — MAU Note (Signed)
Deborah Brock is a 36 y.o. at [redacted]w[redacted]d here in MAU reporting: I had a miscarriage last week. Confirmed it was a miscarriage on Saturday here in MAU. Was given the option to do [facility], pills, or let the pregnancy pass on its own. Pt decided to try to let it pass on its own, but nothing has happened yet. Pt reports very light pink discharge that may happen once a day but denies any other VB. Pt was looking on mychart and was concerned about results from recent lab work and wants to make sure nothing is wrong there. States she had an ectopic pregnancy and her fallopian tube was removed last year.. states she never went to her follow up appt after that. Also reports seeing blood in her stool last week but none since. "I've been having a lot of problems outside of being pregnant." "I feel like body is shutting down". Reports right abdominal pain.  ? ?Pain score: 6 ?Vitals:  ? 06/26/21 2013  ?BP: (!) 144/97  ?Pulse: (!) 101  ?Resp: 19  ?Temp: 98.7 ?F (37.1 ?C)  ?SpO2: 97%  ? ?   ?FHT: n/a  ?Lab orders placed from triage: u/a ? ? ?

## 2021-06-26 NOTE — MAU Provider Note (Signed)
?History  ?  ? ?CSN: [id number] ? ?Arrival date and time: 06/26/21 1957 ? ? Event Date/Time  ? First Provider Initiated Contact with Patient 06/26/21 2120   ?  ? ?Chief Complaint  ?Patient presents with  ? Miscarriage  ? ?HPI ?Patient Deborah Brock is a 36 y.o.  [id number] ? At [redacted]w[redacted]d here to discuss miscarriage. She appears distressed; she has many questions about her lab work from February at an outside facility and is requesting blood work.  ?She is not able to give a clear history if she has established OB-GYN care; ? ?Patient was diagnosed with miscarriage last weekend; she left AMA before options could be discussed with her.  ? ?OB History   ? ? Gravida  ?9  ? Para  ?3  ? Term  ?3  ? Preterm  ?0  ? AB  ?5  ? Living  ?3  ?  ? ? SAB  ?2  ? IAB  ?2  ? Ectopic  ?1  ? Multiple  ?0  ? Live Births  ?3  ?   ?  ?  ? ? ?Past Medical History:  ?Diagnosis Date  ? Hypertension   ? ? ?Past Surgical History:  ?Procedure Laterality Date  ? BREAST SURGERY    ? Augmentation 10/2013  ? DIAGNOSTIC LAPAROSCOPY WITH REMOVAL OF ECTOPIC PREGNANCY Right 01/07/2020  ? Procedure: DIAGNOSTIC LAPAROSCOPY WITH REMOVAL OF ECTOPIC PREGNANCY;  Surgeon: Edwinna Areola, DO;  Location: [facility] OR;  Service: Gynecology;  Laterality: Right;  ? THERAPEUTIC ABORTION    ? May 2016  ? TONSILLECTOMY    ? ? ?Family History  ?Problem Relation Age of Onset  ? Hypertension Mother   ? Diabetes Mother   ? ? ?Social History  ? ?Tobacco Use  ? Smoking status: Every Day  ? Smokeless tobacco: Never  ?Substance Use Topics  ? Alcohol use: No  ?  Comment: socially  ? Drug use: Yes  ?  Types: Marijuana  ?  Comment: every other day  ? ? ?Allergies: No Active Allergies ? ?Medications Prior to Admission  ?Medication Sig Dispense Refill Last Dose  ? acetaminophen (TYLENOL) 325 MG tablet Take 2 tablets (650 mg total) by mouth every 4 (four) hours as needed for moderate pain. 40 tablet 1 Past Week  ? ? ?Review of Systems  ?Constitutional: Negative.   ?HENT: Negative.     ?Respiratory: Negative.    ?Cardiovascular: Negative.   ?Gastrointestinal: Negative.   ?Genitourinary: Negative.   ?Neurological: Negative.   ?Hematological: Negative.   ?Psychiatric/Behavioral: Negative.    ?Physical Exam  ? ?Blood pressure (!) 144/97, pulse (!) 101, temperature 98.7 ?F (37.1 ?C), temperature source Oral, resp. rate 19, height 5\' 2"  (1.575 m), weight 64.8 kg, last menstrual period 04/08/2021, SpO2 97 %, unknown if currently breastfeeding. ? ?Physical Exam ?Constitutional:   ?   Appearance: Normal appearance.  ?Musculoskeletal:     ?   General: Normal range of motion.  ?Skin: ?   General: Skin is warm.  ?Neurological:  ?   General: No focal deficit present.  ?   Mental Status: She is alert and oriented to person, place, and time.  ? ? ?MAU Course  ?Procedures ? ?MDM ?Upon entering the room patient was crying and stating that she had to leave as her only ride is there to pick her up. She is requesting medicine for her miscarriage. I explained that we cannot give cytotec without a recent CBC; she agrees to blood draw  and then will wait for RX.  ? ?Assessment and Plan  ? ?1. Miscarriage   ?-patient given detailed instructions about how to take cytotec; reviewed strict warning signs and when to come return to MAU ?-she has appt scheduled for [facility] follow up in June ?-I explained that cytotec and pain medicine will be sent to her pharmacy; she plans to pick up tomorrow at 2 pm.  ? ?Marylene Land ?06/26/2021, 9:21 PM  ?

## 2021-06-27 ENCOUNTER — Other Ambulatory Visit: Payer: Self-pay | Admitting: Student

## 2021-06-27 MED ORDER — MISOPROSTOL 200 MCG PO TABS: 800.0000 ug | ORAL_TABLET | Freq: Once | ORAL | 1 refills | Status: DC

## 2021-06-27 MED ORDER — PROMETHAZINE HCL 25 MG PO TABS: 25.0000 mg | ORAL_TABLET | Freq: Four times a day (QID) | ORAL | 1 refills | Status: DC | PRN

## 2021-06-27 MED ORDER — ACETAMINOPHEN-CODEINE #3 300-30 MG PO TABS: 2.0000 | ORAL_TABLET | Freq: Four times a day (QID) | ORAL | 0 refills | Status: DC | PRN

## 2021-07-03 ENCOUNTER — Inpatient Hospital Stay (HOSPITAL_COMMUNITY)
Admission: AD | Admit: 2021-07-03 | Discharge: 2021-07-04 | Disposition: A | Discharge status: Home / Self Care | Payer: Medicaid Other | Attending: Obstetrics and Gynecology | Admitting: Obstetrics and Gynecology

## 2021-07-03 DIAGNOSIS — Z711 Person with feared health complaint in whom no diagnosis is made: Secondary | ICD-10-CM

## 2021-07-03 DIAGNOSIS — N939 Abnormal uterine and vaginal bleeding, unspecified: Secondary | ICD-10-CM

## 2021-07-03 DIAGNOSIS — O036 Delayed or excessive hemorrhage following complete or unspecified spontaneous abortion: Secondary | ICD-10-CM | POA: Insufficient documentation

## 2021-07-03 DIAGNOSIS — O039 Complete or unspecified spontaneous abortion without complication: Secondary | ICD-10-CM

## 2021-07-03 NOTE — MAU Note (Signed)
Deborah Brock is a 36 y.o. at [redacted]w[redacted]d here in MAU reporting: had miscarriage and was given pills, started bleeding heavy today, changing 4 pads an hour. Feeling dizzy and almost passed out.  LMP:  Onset of complaint: today Pain score: 10/10 Vitals:   07/03/21 2358  BP: 104/60  Pulse: 73  Resp: 16  Temp: 97.9 F (36.6 C)  SpO2: 99%     FHT: Lab orders placed from triage: Urinalysis

## 2021-07-04 ENCOUNTER — Encounter (HOSPITAL_COMMUNITY): Payer: Self-pay | Admitting: Obstetrics and Gynecology

## 2021-07-04 DIAGNOSIS — O039 Complete or unspecified spontaneous abortion without complication: Secondary | ICD-10-CM

## 2021-07-04 DIAGNOSIS — O036 Delayed or excessive hemorrhage following complete or unspecified spontaneous abortion: Secondary | ICD-10-CM | POA: Diagnosis not present

## 2021-07-04 LAB — CBC WITH DIFFERENTIAL/PLATELET
Abs Immature Granulocytes: 0.13 10*3/uL — ABNORMAL HIGH (ref 0.00–0.07)
Basophils Absolute: 0.1 10*3/uL (ref 0.0–0.1)
Basophils Relative: 1 %
Eosinophils Absolute: 0.3 10*3/uL (ref 0.0–0.5)
Eosinophils Relative: 2 %
HCT: 39.2 % (ref 36.0–46.0)
Hemoglobin: 13.3 g/dL (ref 12.0–15.0)
Immature Granulocytes: 1 %
Lymphocytes Relative: 26 %
Lymphs Abs: 4.1 10*3/uL — ABNORMAL HIGH (ref 0.7–4.0)
MCH: 29 pg (ref 26.0–34.0)
MCHC: 33.9 g/dL (ref 30.0–36.0)
MCV: 85.4 fL (ref 80.0–100.0)
Monocytes Absolute: 0.9 10*3/uL (ref 0.1–1.0)
Monocytes Relative: 6 %
Neutro Abs: 9.9 10*3/uL — ABNORMAL HIGH (ref 1.7–7.7)
Neutrophils Relative %: 64 %
Platelets: 353 10*3/uL (ref 150–400)
RBC: 4.59 MIL/uL (ref 3.87–5.11)
RDW: 13.2 % (ref 11.5–15.5)
WBC: 15.4 10*3/uL — ABNORMAL HIGH (ref 4.0–10.5)
nRBC: 0 % (ref 0.0–0.2)

## 2021-07-04 LAB — HCG, QUANTITATIVE, PREGNANCY: hCG, Beta Chain, Quant, S: 1277 m[IU]/mL — ABNORMAL HIGH (ref <5)

## 2021-07-04 MED ORDER — KETOROLAC TROMETHAMINE 60 MG/2ML IM SOLN
60.0000 mg | Freq: Once | INTRAMUSCULAR | Status: AC
  Administered 2021-07-04: 60 mg via INTRAMUSCULAR
  Filled 2021-07-04: qty 2

## 2021-07-04 NOTE — MAU Provider Note (Signed)
History     CSN: [id number]  Arrival date and time: 07/03/21 2336   None     Chief Complaint  Patient presents with   Vaginal Bleeding   HPI  [name] is a 36 y.o. female [phone] with a known SAB diagnosed this week, here with concerns about vaginal bleeding. She took cytotec pills on Monday for SAB. US showed 6w1 fetal loss.  Yesterday she started having spotting then heavy bleeding.  She reports heavy bleeding for several hours and now the bleeding has lessoned. She reports passing clots and tissue. She overall feels Brock. Due to the dizziness she wanted to make sure she didn't lose to much blood.   Patient walked into MAU without dizziness.   OB History     Gravida  9   Para  3   Term  3   Preterm  0   AB  5   Living  3      SAB  2   IAB  2   Ectopic  1   Multiple  0   Live Births  3           Past Medical History:  Diagnosis Date   Hypertension     Past Surgical History:  Procedure Laterality Date   BREAST SURGERY     Augmentation 10/2013   DIAGNOSTIC LAPAROSCOPY WITH REMOVAL OF ECTOPIC PREGNANCY Right 01/07/2020   Procedure: DIAGNOSTIC LAPAROSCOPY WITH REMOVAL OF ECTOPIC PREGNANCY;  Surgeon: Edwinna Areola, DO;  Location: [facility];  Service: Gynecology;  Laterality: Right;   THERAPEUTIC ABORTION     May 2016   TONSILLECTOMY      Family History  Problem Relation Age of Onset   Hypertension Mother    Diabetes Mother     Social History   Tobacco Use   Smoking status: Every Day   Smokeless tobacco: Never  Substance Use Topics   Alcohol use: No    Comment: socially   Drug use: Yes    Types: Marijuana    Comment: every other day    Allergies: No Active Allergies  Medications Prior to Admission  Medication Sig Dispense Refill Last Dose   acetaminophen-codeine (TYLENOL #3) 300-30 MG tablet Take 2 tablets by mouth every 6 (six) hours as needed for moderate pain. 20 tablet 0    promethazine (PHENERGAN) 25 MG tablet  Take 1 tablet (25 mg total) by mouth every 6 (six) hours as needed for nausea or vomiting. 15 tablet 1    acetaminophen (TYLENOL) 325 MG tablet Take 2 tablets (650 mg total) by mouth every 4 (four) hours as needed for moderate pain. 40 tablet 1    misoprostol (CYTOTEC) 200 MCG tablet Take 4 tablets (800 mcg total) by mouth once for 1 dose. Place two pills between lower gum and cheek on either side (4 in total). If no bleeding within 3 days, repeat. 4 tablet 1    Results for orders placed or performed during the hospital encounter of 07/03/21 (from the past 48 hour(s))  CBC with Differential/Platelet     Status: Abnormal   Collection Time: 07/04/21 12:08 AM  Result Value Ref Range   WBC 15.4 (H) 4.0 - 10.5 K/uL   RBC 4.59 3.87 - 5.11 MIL/uL   Hemoglobin 13.3 12.0 - 15.0 g/dL   HCT 50.0 93.8 - 18.2 %   MCV 85.4 80.0 - 100.0 fL   MCH 29.0 26.0 - 34.0 pg   MCHC 33.9 30.0 - 36.0 g/dL  RDW 13.2 11.5 - 15.5 %   Platelets 353 150 - 400 K/uL   nRBC 0.0 0.0 - 0.2 %   Neutrophils Relative % 64 %   Neutro Abs 9.9 (H) 1.7 - 7.7 K/uL   Lymphocytes Relative 26 %   Lymphs Abs 4.1 (H) 0.7 - 4.0 K/uL   Monocytes Relative 6 %   Monocytes Absolute 0.9 0.1 - 1.0 K/uL   Eosinophils Relative 2 %   Eosinophils Absolute 0.3 0.0 - 0.5 K/uL   Basophils Relative 1 %   Basophils Absolute 0.1 0.0 - 0.1 K/uL   Immature Granulocytes 1 %   Abs Immature Granulocytes 0.13 (H) 0.00 - 0.07 K/uL    Comment: Performed at [facility], [address], [place], [place] [address]    Review of Systems  Constitutional:  Negative for fever.  Gastrointestinal:  Positive for abdominal pain.  Genitourinary:  Positive for vaginal bleeding.  Physical Exam   Blood pressure 104/60, pulse 73, temperature 97.9 F (36.6 C), temperature source Oral, resp. rate 16, height 5\' 2"  (1.575 m), weight 66.9 kg, last menstrual period 04/08/2021, SpO2 99 %, unknown if currently breastfeeding.  Physical Exam Vitals reviewed.   Constitutional:      General: She is not in acute distress.    Appearance: Normal appearance. She is not ill-appearing, toxic-appearing or diaphoretic.  Genitourinary:    Comments: Cervix FT, thick, anterior. Exam by 04/10/2021, NP  Skin:    General: Skin is warm.  Neurological:     Mental Status: She is alert and oriented to person, place, and time.  Psychiatric:        Behavior: Behavior normal.    MAU Course  Procedures  MDM  O positive blood type  Toradol given 60 IM Hgb Stable Patient plans to follow up with Dr. Venia Carbon and has follow up plans.   Assessment and Plan   A:  1. SAB (spontaneous abortion)   2. Episode of heavy vaginal bleeding   3. Physically well but worried      P:  DC Home  Return to MAU if symptoms worsen Bleeding precautions Support given   Mindi Slicker I, NP 07/04/2021 4:10 AM

## 2021-07-12 ENCOUNTER — Encounter: Payer: Self-pay | Admitting: Obstetrics & Gynecology

## 2021-07-19 ENCOUNTER — Ambulatory Visit: Payer: Medicaid Other | Admitting: Obstetrics & Gynecology

## 2021-09-01 ENCOUNTER — Other Ambulatory Visit: Payer: Self-pay | Admitting: Physician Assistant

## 2021-09-01 DIAGNOSIS — R221 Localized swelling, mass and lump, neck: Secondary | ICD-10-CM

## 2021-09-01 DIAGNOSIS — K118 Other diseases of salivary glands: Secondary | ICD-10-CM

## 2021-09-01 DIAGNOSIS — R591 Generalized enlarged lymph nodes: Secondary | ICD-10-CM

## 2021-09-02 ENCOUNTER — Ambulatory Visit
Admission: RE | Admit: 2021-09-02 | Discharge: 2021-09-02 | Disposition: A | Discharge status: Home / Self Care | Payer: Medicaid Other | Source: Ambulatory Visit | Attending: Physician Assistant | Admitting: Physician Assistant

## 2021-09-02 DIAGNOSIS — R591 Generalized enlarged lymph nodes: Secondary | ICD-10-CM

## 2021-09-02 DIAGNOSIS — K118 Other diseases of salivary glands: Secondary | ICD-10-CM

## 2021-09-02 DIAGNOSIS — R221 Localized swelling, mass and lump, neck: Secondary | ICD-10-CM

## 2021-09-07 ENCOUNTER — Other Ambulatory Visit: Payer: Self-pay | Admitting: Physician Assistant

## 2021-09-07 DIAGNOSIS — R591 Generalized enlarged lymph nodes: Secondary | ICD-10-CM

## 2021-09-07 DIAGNOSIS — K118 Other diseases of salivary glands: Secondary | ICD-10-CM

## 2021-11-04 ENCOUNTER — Inpatient Hospital Stay: Admission: RE | Admit: 2021-11-04 | Payer: Medicaid Other | Source: Ambulatory Visit

## 2021-11-27 ENCOUNTER — Other Ambulatory Visit: Payer: Self-pay

## 2021-11-27 ENCOUNTER — Inpatient Hospital Stay (HOSPITAL_COMMUNITY): Payer: Medicaid Other

## 2021-11-27 ENCOUNTER — Inpatient Hospital Stay (HOSPITAL_COMMUNITY)
Admission: AD | Admit: 2021-11-27 | Discharge: 2021-11-27 | Disposition: A | Discharge status: Home / Self Care | Payer: Medicaid Other | Attending: Obstetrics & Gynecology | Admitting: Obstetrics & Gynecology

## 2021-11-27 ENCOUNTER — Encounter: Payer: Self-pay | Admitting: Student

## 2021-11-27 DIAGNOSIS — O99891 Other specified diseases and conditions complicating pregnancy: Secondary | ICD-10-CM | POA: Diagnosis not present

## 2021-11-27 DIAGNOSIS — Z3A01 Less than 8 weeks gestation of pregnancy: Secondary | ICD-10-CM | POA: Diagnosis not present

## 2021-11-27 DIAGNOSIS — R519 Headache, unspecified: Secondary | ICD-10-CM | POA: Diagnosis not present

## 2021-11-27 DIAGNOSIS — O09291 Supervision of pregnancy with other poor reproductive or obstetric history, first trimester: Secondary | ICD-10-CM | POA: Diagnosis not present

## 2021-11-27 DIAGNOSIS — O26891 Other specified pregnancy related conditions, first trimester: Secondary | ICD-10-CM | POA: Insufficient documentation

## 2021-11-27 DIAGNOSIS — M549 Dorsalgia, unspecified: Secondary | ICD-10-CM | POA: Insufficient documentation

## 2021-11-27 DIAGNOSIS — O161 Unspecified maternal hypertension, first trimester: Secondary | ICD-10-CM | POA: Insufficient documentation

## 2021-11-27 DIAGNOSIS — O3680X0 Pregnancy with inconclusive fetal viability, not applicable or unspecified: Secondary | ICD-10-CM | POA: Diagnosis not present

## 2021-11-27 DIAGNOSIS — R1030 Lower abdominal pain, unspecified: Secondary | ICD-10-CM | POA: Diagnosis not present

## 2021-11-27 DIAGNOSIS — R220 Localized swelling, mass and lump, head: Secondary | ICD-10-CM | POA: Diagnosis not present

## 2021-11-27 LAB — URINALYSIS, ROUTINE W REFLEX MICROSCOPIC
Bilirubin Urine: NEGATIVE
Glucose, UA: NEGATIVE mg/dL
Hgb urine dipstick: NEGATIVE
Ketones, ur: NEGATIVE mg/dL
Leukocytes,Ua: NEGATIVE
Nitrite: NEGATIVE
Protein, ur: NEGATIVE mg/dL
Specific Gravity, Urine: 1.016 (ref 1.005–1.030)
pH: 5 (ref 5.0–8.0)

## 2021-11-27 LAB — POCT PREGNANCY, URINE: Preg Test, Ur: POSITIVE — AB

## 2021-11-27 LAB — HCG, QUANTITATIVE, PREGNANCY: hCG, Beta Chain, Quant, S: 639 m[IU]/mL — ABNORMAL HIGH (ref <5)

## 2021-11-27 NOTE — MAU Note (Signed)
Deborah Brock is a 36 y.o. at Unknown here in MAU reporting: she has headache that is being caused by a lump on the right side of her face/jaw.  Reports she's taking Tylenol for H/A, last took yesterday.  States had an MRI scheduled for the lump, but missed the appointment. LMP: 10/26/2021 unsure, just approximating Onset of complaint: 1 month Pain score: 8 Vitals:   11/27/21 1409  BP: 126/77  Pulse: 78  Resp: 18  Temp: 97.9 F (36.6 C)  SpO2: 99%     FHT:NA Lab orders placed from triage:  UPT

## 2021-11-27 NOTE — MAU Provider Note (Signed)
[facility] Attending MAU Note  Chief Complaint: Headache and Abdominal Pain    Event Date/Time   First Provider Initiated Contact with Patient 11/27/21 [place] is a 36 y.o. [id number] at [redacted]w[redacted]d by LMP who presents with headache that is being caused by a lump on the right side of her face/jaw.  Reports she's taking Tylenol for pain, last took yesterday.  Ultrasound in 09/02/21 showed right parotid and submandibular glands are unremarkable by ultrasound, subcentimeter intraparotid lymph nodes, mildly prominent submandibular lymph node.  Patient states had an MRI scheduled for the lump, but missed the appointment.   She wants to get the MRI today.  Patient also reports some intermittent lower abdominal pain, side pain and back pain. She has had miscarriages, D&C and ectopic pregnancy in the past so she is very worried.  Denies any vaginal bleeding, vaginal discharge, fevers, chills, sweats, dysuria, nausea, vomiting, other GI or GU symptoms or other general symptoms.     Past Medical History:  Diagnosis Date   Hypertension    OB History  Gravida Para Term Preterm AB Living  10 3 3  0 6 3  SAB IAB Ectopic Multiple Live Births  3 2 1  0 3    # Outcome Date GA Lbr Len/2nd Weight Sex Delivery Anes PTL Lv  10 Current           9 SAB 06/2021          8 Ectopic 2021          7 Term 10/25/15 [redacted]w[redacted]d 28:10 / 00:41 2860 g M Vag-Spont EPI  LIV  6 Term      Vag-Spont   LIV  5 Term      Vag-Spont   LIV  4 IAB           3 IAB           2 SAB           1 SAB            Past Surgical History:  Procedure Laterality Date   BREAST SURGERY     Augmentation 10/2013   DIAGNOSTIC LAPAROSCOPY WITH REMOVAL OF ECTOPIC PREGNANCY Right 01/07/2020   Procedure: DIAGNOSTIC LAPAROSCOPY WITH REMOVAL OF ECTOPIC PREGNANCY;  Surgeon: Sherlyn Hay, DO;  Location: [facility];  Service: Gynecology;  Laterality: Right;   THERAPEUTIC ABORTION     May 2016   TONSILLECTOMY      Social History   Socioeconomic History   Marital status: Single    Spouse name: Not on file   Number of children: Not on file   Years of education: Not on file   Highest education level: Not on file  Occupational History   Not on file  Tobacco Use   Smoking status: Every Day   Smokeless tobacco: Never  Substance and Sexual Activity   Alcohol use: No    Comment: socially   Drug use: Yes    Types: Marijuana    Comment: every other day   Sexual activity: Yes    Birth control/protection: None  Other Topics Concern   Not on file  Social History Narrative   Not on file   Social Determinants of Health   Financial Resource Strain: Not on file  Food Insecurity: Not on file  Transportation Needs: Not on file  Physical Activity: Not on file  Stress: Not on file  Social Connections: Not on file  Intimate Partner  Violence: Not on file   No current facility-administered medications on file prior to encounter.   Current Outpatient Medications on File Prior to Encounter  Medication Sig Dispense Refill   acetaminophen-codeine (TYLENOL #3) 300-30 MG tablet Take 2 tablets by mouth every 6 (six) hours as needed for moderate pain. 20 tablet 0   promethazine (PHENERGAN) 25 MG tablet Take 1 tablet (25 mg total) by mouth every 6 (six) hours as needed for nausea or vomiting. 15 tablet 1   acetaminophen (TYLENOL) 325 MG tablet Take 2 tablets (650 mg total) by mouth every 4 (four) hours as needed for moderate pain. 40 tablet 1   No Active Allergies  ROS: Pertinent items in HPI  OBJECTIVE BP 122/87 (BP Location: Right Arm)   Pulse 68   Temp 98 F (36.7 C) (Oral)   Resp 20   Ht 5\' 2"  (1.575 m)   Wt 66.5 kg   LMP 10/26/2021 (Approximate)   SpO2 100%   Breastfeeding Unknown   BMI 26.83 kg/m  CONSTITUTIONAL: Well-developed, well-nourished female in no acute distress.  HENT:  Normocephalic, atraumatic, External right and left ear normal. Oropharynx is clear and moist EYES:  Conjunctivae and EOM are normal. Pupils are equal, round, and reactive to light. No scleral icterus.  NECK: Normal range of motion, supple, no masses.  Normal thyroid.  SKIN: Skin is warm and dry. No rash noted. Not diaphoretic. No erythema. No pallor. [place]: Alert and oriented to person, place, and time. Normal reflexes, muscle tone coordination. No cranial nerve deficit noted. PSYCHIATRIC: Normal mood and affect. Normal behavior. Normal judgment and thought content. CARDIOVASCULAR: Normal heart rate noted RESPIRATORY: Effort and breath sounds normal, no problems with respiration noted. ABDOMEN: Soft, normal bowel sounds, no distention noted.  No tenderness, rebound or guarding.  PELVIC: Deferred MUSCULOSKELETAL: Normal range of motion. No tenderness.  No cyanosis, clubbing, or edema.  2+ distal pulses.  LAB RESULTS Results for orders placed or performed during the hospital encounter of 11/27/21 (from the past 48 hour(s))  Pregnancy, urine POC     Status: Abnormal   Collection Time: 11/27/21  2:14 PM  Result Value Ref Range   Preg Test, Ur POSITIVE (A) NEGATIVE    Comment:        THE SENSITIVITY OF THIS METHODOLOGY IS >24 mIU/mL   Urinalysis, Routine w reflex microscopic Urine, Clean Catch     Status: None   Collection Time: 11/27/21  2:18 PM  Result Value Ref Range   Color, Urine YELLOW YELLOW   APPearance CLEAR CLEAR   Specific Gravity, Urine 1.016 1.005 - 1.030   pH 5.0 5.0 - 8.0   Glucose, UA NEGATIVE NEGATIVE mg/dL   Hgb urine dipstick NEGATIVE NEGATIVE   Bilirubin Urine NEGATIVE NEGATIVE   Ketones, ur NEGATIVE NEGATIVE mg/dL   Protein, ur NEGATIVE NEGATIVE mg/dL   Nitrite NEGATIVE NEGATIVE   Leukocytes,Ua NEGATIVE NEGATIVE    Comment: Performed at [facility] [address] [place], [place] [address]  hCG, quantitative, pregnancy     Status: Abnormal   Collection Time: 11/27/21  3:23 PM  Result Value Ref Range   hCG, Beta Chain, Quant, S 639 (H) <5 mIU/mL     Comment:          GEST. AGE      CONC.  (mIU/mL)   <=1 WEEK        5 - 50     2 WEEKS       50 -  500     3 WEEKS       100 - 10,000     4 WEEKS     1,000 - 30,000     5 WEEKS     3,500 - 115,000   6-8 WEEKS     12,000 - 270,000    12 WEEKS     15,000 - 220,000        FEMALE AND NON-PREGNANT FEMALE:     LESS THAN 5 mIU/mL Performed at [facility], [place] [address], [place], [place] [address]     IMAGING US OB LESS THAN 14 WEEKS WITH OB TRANSVAGINAL  Result Date: 11/27/2021 CLINICAL DATA:  Headache flank pain, history of ectopic pregnancy EXAM: OBSTETRIC <14 WK ULTRASOUND TECHNIQUE: Transabdominal ultrasound was performed for evaluation of the gestation as well as the maternal uterus and adnexal regions. COMPARISON:  06/20/2021 FINDINGS: Intrauterine gestational sac: None Yolk sac:  Not Visualized. Embryo:  Not Visualized. Cardiac Activity: Not Visualized. Heart Rate: Not visualized. Subchorionic hemorrhage:  None visualized. Maternal uterus/adnexae: Unremarkable. Small bilateral ovarian follicles small free fluid in the low pelvis. IMPRESSION: No candidate intrauterine gestational identified by ultrasound. Early pregnancy of unknown location. Recommend ectopic precautions, serial beta hCG and follow-up ultrasound in 7-14 days to assess for continued development and viability. Electronically Signed   By: Delanna Ahmadi M.D.   On: 11/27/2021 17:12    MAU COURSE Ultrasound and BHCG ordered Recommended outpatient evaluation for her jaw, continue Tylenol as needed.  ASSESSMENT 1. Pregnancy of unknown anatomic location   2. Pain affecting pregnancy, antepartum     PLAN Ultrasound results reviewed with patient. She has pregnancy of unknown location. HCG 639, nothing seen on ultrasound. Scheduled for follow up HCG at [facility] on 11/30/21 Ectopic precautions reviewed in detail with patient. She was discharged to home in stable condition.  Allergies as of 11/27/2021   No Active  Allergies      Medication List     STOP taking these medications    acetaminophen-codeine 300-30 MG tablet Commonly known as: TYLENOL #3   promethazine 25 MG tablet Commonly known as: PHENERGAN       TAKE these medications    acetaminophen 325 MG tablet Commonly known as: Tylenol Take 2 tablets (650 mg total) by mouth every 4 (four) hours as needed for moderate pain.       Evaluation does not show pathology that would require ongoing emergent intervention or inpatient treatment. Patient is hemodynamically stable and mentating appropriately. Discussed findings and plan with patient, who agrees with care plan. All questions answered. Return precautions discussed and outpatient follow up recommendations given.   Verita Schneiders, MD, [place] for [employer], [place]

## 2021-11-30 ENCOUNTER — Ambulatory Visit: Payer: Medicaid Other

## 2021-12-07 ENCOUNTER — Encounter (HOSPITAL_COMMUNITY): Payer: Self-pay | Admitting: Obstetrics & Gynecology

## 2021-12-07 ENCOUNTER — Inpatient Hospital Stay (HOSPITAL_COMMUNITY)
Admission: AD | Admit: 2021-12-07 | Discharge: 2021-12-07 | Disposition: A | Discharge status: Home / Self Care | Payer: Medicaid Other | Attending: Obstetrics & Gynecology | Admitting: Obstetrics & Gynecology

## 2021-12-07 ENCOUNTER — Inpatient Hospital Stay (HOSPITAL_COMMUNITY): Payer: Medicaid Other

## 2021-12-07 DIAGNOSIS — Z3491 Encounter for supervision of normal pregnancy, unspecified, first trimester: Secondary | ICD-10-CM

## 2021-12-07 DIAGNOSIS — O09291 Supervision of pregnancy with other poor reproductive or obstetric history, first trimester: Secondary | ICD-10-CM | POA: Diagnosis not present

## 2021-12-07 DIAGNOSIS — Z3A01 Less than 8 weeks gestation of pregnancy: Secondary | ICD-10-CM

## 2021-12-07 DIAGNOSIS — O26891 Other specified pregnancy related conditions, first trimester: Secondary | ICD-10-CM | POA: Insufficient documentation

## 2021-12-07 DIAGNOSIS — R1030 Lower abdominal pain, unspecified: Secondary | ICD-10-CM | POA: Insufficient documentation

## 2021-12-07 DIAGNOSIS — R109 Unspecified abdominal pain: Secondary | ICD-10-CM | POA: Diagnosis not present

## 2021-12-07 LAB — CBC
HCT: 39.4 % (ref 36.0–46.0)
Hemoglobin: 13.2 g/dL (ref 12.0–15.0)
MCH: 28.2 pg (ref 26.0–34.0)
MCHC: 33.5 g/dL (ref 30.0–36.0)
MCV: 84.2 fL (ref 80.0–100.0)
Platelets: 363 10*3/uL (ref 150–400)
RBC: 4.68 MIL/uL (ref 3.87–5.11)
RDW: 12.7 % (ref 11.5–15.5)
WBC: 12 10*3/uL — ABNORMAL HIGH (ref 4.0–10.5)
nRBC: 0 % (ref 0.0–0.2)

## 2021-12-07 LAB — URINALYSIS, ROUTINE W REFLEX MICROSCOPIC
Bilirubin Urine: NEGATIVE
Glucose, UA: NEGATIVE mg/dL
Hgb urine dipstick: NEGATIVE
Ketones, ur: NEGATIVE mg/dL
Leukocytes,Ua: NEGATIVE
Nitrite: NEGATIVE
Protein, ur: NEGATIVE mg/dL
Specific Gravity, Urine: 1.016 (ref 1.005–1.030)
pH: 5 (ref 5.0–8.0)

## 2021-12-07 LAB — HCG, QUANTITATIVE, PREGNANCY: hCG, Beta Chain, Quant, S: 17283 m[IU]/mL — ABNORMAL HIGH (ref <5)

## 2021-12-07 NOTE — Discharge Instructions (Signed)

## 2021-12-07 NOTE — MAU Provider Note (Signed)
History     CSN: [id number]  Arrival date and time: 12/07/21 1620   Event Date/Time   First Provider Initiated Contact with Patient 12/07/21 1700      Chief Complaint  Patient presents with   Abdominal Pain   HPI  Deborah Brock is a 36 y.o. [id number] at [redacted]w[redacted]d who presents for evaluation of lower abdominal pain. Patient reports she is having cramping all along the bottom of her abdomen. Patient rates the pain as a 8/10 and has not tried anything for the pain. She was seen in MAU on 10/14 and diagnosed with a pregnancy of unknown location. She did not keep scheduled follow up at [facility] on 10/17 and states she was supposed to have a repeat ultrasound in 10 days.  She denies any vaginal bleeding, discharge, and leaking of fluid. Denies any constipation, diarrhea or any urinary complaints.   OB History     Gravida  10   Para  3   Term  3   Preterm  0   AB  6   Living  3      SAB  3   IAB  2   Ectopic  1   Multiple  0   Live Births  3           Past Medical History:  Diagnosis Date   Hypertension     Past Surgical History:  Procedure Laterality Date   BREAST SURGERY     Augmentation 10/2013   DIAGNOSTIC LAPAROSCOPY WITH REMOVAL OF ECTOPIC PREGNANCY Right 01/07/2020   Procedure: DIAGNOSTIC LAPAROSCOPY WITH REMOVAL OF ECTOPIC PREGNANCY;  Surgeon: Edwinna Areola, DO;  Location: [facility];  Service: Gynecology;  Laterality: Right;   THERAPEUTIC ABORTION     May 2016   TONSILLECTOMY      Family History  Problem Relation Age of Onset   Hypertension Mother    Diabetes Mother     Social History   Tobacco Use   Smoking status: Every Day   Smokeless tobacco: Never  Vaping Use   Vaping Use: Never used  Substance Use Topics   Alcohol use: No    Comment: socially   Drug use: Not Currently    Types: Marijuana    Comment: every other day    Allergies: No Known Allergies  No medications prior to admission.    Review of Systems  Constitutional:  Negative.  Negative for fatigue and fever.  HENT: Negative.    Respiratory: Negative.  Negative for shortness of breath.   Cardiovascular: Negative.  Negative for chest pain.  Gastrointestinal:  Positive for abdominal pain. Negative for constipation, diarrhea, nausea and vomiting.  Genitourinary: Negative.  Negative for dysuria, vaginal bleeding and vaginal discharge.  Neurological: Negative.  Negative for dizziness and headaches.   Physical Exam   Blood pressure 125/66, pulse 73, temperature 98.3 F (36.8 C), temperature source Oral, resp. rate 16, height 5\' 2"  (1.575 m), weight 66.6 kg, last menstrual period 10/26/2021, SpO2 99 %, unknown if currently breastfeeding.  Patient Vitals for the past 24 hrs:  BP Temp Temp src Pulse Resp SpO2 Height Weight  12/07/21 1642 125/66 98.3 F (36.8 C) Oral 73 16 99 % 5\' 2"  (1.575 m) 66.6 kg    Physical Exam Vitals and nursing note reviewed.  Constitutional:      General: She is not in acute distress.    Appearance: She is well-developed.  HENT:     Head: Normocephalic.  Eyes:     Pupils:  Pupils are equal, round, and reactive to light.  Cardiovascular:     Rate and Rhythm: Normal rate and regular rhythm.     Heart sounds: Normal heart sounds.  Pulmonary:     Effort: Pulmonary effort is normal. No respiratory distress.     Breath sounds: Normal breath sounds.  Abdominal:     General: Bowel sounds are normal. There is no distension.     Palpations: Abdomen is soft.     Tenderness: There is no abdominal tenderness.  Skin:    General: Skin is warm and dry.  Neurological:     Mental Status: She is alert and oriented to person, place, and time.  Psychiatric:        Mood and Affect: Mood normal.        Behavior: Behavior normal.        Thought Content: Thought content normal.        Judgment: Judgment normal.      MAU Course  Procedures  Results for orders placed or performed during the hospital encounter of 12/07/21 (from the past  24 hour(s))  Urinalysis, Routine w reflex microscopic Urine, Clean Catch     Status: Abnormal   Collection Time: 12/07/21  4:57 PM  Result Value Ref Range   Color, Urine YELLOW YELLOW   APPearance CLOUDY (A) CLEAR   Specific Gravity, Urine 1.016 1.005 - 1.030   pH 5.0 5.0 - 8.0   Glucose, UA NEGATIVE NEGATIVE mg/dL   Hgb urine dipstick NEGATIVE NEGATIVE   Bilirubin Urine NEGATIVE NEGATIVE   Ketones, ur NEGATIVE NEGATIVE mg/dL   Protein, ur NEGATIVE NEGATIVE mg/dL   Nitrite NEGATIVE NEGATIVE   Leukocytes,Ua NEGATIVE NEGATIVE  CBC     Status: Abnormal   Collection Time: 12/07/21  5:15 PM  Result Value Ref Range   WBC 12.0 (H) 4.0 - 10.5 K/uL   RBC 4.68 3.87 - 5.11 MIL/uL   Hemoglobin 13.2 12.0 - 15.0 g/dL   HCT 38.9 37.3 - 42.8 %   MCV 84.2 80.0 - 100.0 fL   MCH 28.2 26.0 - 34.0 pg   MCHC 33.5 30.0 - 36.0 g/dL   RDW 76.8 11.5 - 72.6 %   Platelets 363 150 - 400 K/uL   nRBC 0.0 0.0 - 0.2 %  hCG, quantitative, pregnancy     Status: Abnormal   Collection Time: 12/07/21  5:15 PM  Result Value Ref Range   hCG, Beta Chain, Quant, S 17,283 (H) <5 mIU/mL     US OB Transvaginal  Result Date: 12/07/2021 CLINICAL DATA:  abdominal pain, PUL EXAM: TRANSVAGINAL OB ULTRASOUND TECHNIQUE: Transvaginal ultrasound was performed for complete evaluation of the gestation as well as the maternal uterus, adnexal regions, and pelvic cul-de-sac. COMPARISON:  Ultrasound 11/27/2021 FINDINGS: Intrauterine gestational sac: Single Yolk sac:  Yes Embryo:  No Cardiac Activity: Not Visualized. Heart Rate: Not applicable MSD: 11.5  mm   6 w   0  d Subchorionic hemorrhage:  Not applicable. Maternal uterus/adnexae: Normal ovaries. Trace simple free fluid in the pelvis, nonspecific. IMPRESSION: Intrauterine gestational sac with yolk sac visualized but no embryo yet visualized. Recommend follow-up quantitative B-HCG levels and follow-up US in 14 days to assess viability. This recommendation follows SRU consensus  guidelines: Diagnostic Criteria for Nonviable Pregnancy Early in the First Trimester. Malva Limes Med 2013; 203:5597-41. Electronically Signed   By: Caprice Renshaw M.D.   On: 12/07/2021 18:02     MDM Labs ordered and reviewed.   CBC, HCG  Offered pain medication and patient declined US OB Transvaginal  CNM independently reviewed the imaging ordered. Imaging show gestational sac with yolk sac in uterus  CNM scheduled viability ultrasound for Nov 7 at 11am.   Assessment and Plan   1. Normal intrauterine pregnancy on prenatal ultrasound in first trimester   2. [redacted] weeks gestation of pregnancy     -Discharge home in stable condition -First trimester precautions discussed -Patient advised to follow-up with [facility] for repeat ultrasound -Patient may return to MAU as needed or if her condition were to change or worsen  Wende Mott, CNM 12/07/2021, 5:00 PM

## 2021-12-07 NOTE — MAU Note (Addendum)
...  Deborah Brock is a 36 y.o. at [redacted]w[redacted]d here in MAU reporting: Constant left mid to lower abdominal pain that began this past Saturday. She reports nothing eases the pain or makes it worse. She reports she is having frequent bowel movements. Denies VB. Denies pelvic pain.  She reports she had an [place] on 10/14 and reports she was supposed to have a follow up US 10-14 days later.  Onset of complaint: Saturday Pain score: 8/10 left mid to lower abdomen  Vitals:   12/07/21 1642  BP: 125/66  Pulse: 73  Resp: 16  Temp: 98.3 F (36.8 C)   Lab orders placed from triage: UA

## 2021-12-21 ENCOUNTER — Other Ambulatory Visit: Payer: Medicaid Other

## 2021-12-28 ENCOUNTER — Ambulatory Visit: Payer: Self-pay | Admitting: Advanced Practice Midwife

## 2021-12-28 ENCOUNTER — Other Ambulatory Visit: Payer: Medicaid Other

## 2021-12-28 ENCOUNTER — Other Ambulatory Visit: Payer: Medicaid Other | Admitting: Advanced Practice Midwife

## 2022-01-24 ENCOUNTER — Ambulatory Visit: Payer: Medicaid Other | Admitting: *Deleted

## 2022-01-24 DIAGNOSIS — Z2839 Other underimmunization status: Secondary | ICD-10-CM

## 2022-01-24 DIAGNOSIS — O0990 Supervision of high risk pregnancy, unspecified, unspecified trimester: Secondary | ICD-10-CM

## 2022-01-24 MED ORDER — PROMETHAZINE HCL 25 MG PO TABS: 25.0000 mg | ORAL_TABLET | Freq: Four times a day (QID) | ORAL | 1 refills | Status: DC | PRN

## 2022-01-24 MED ORDER — BLOOD PRESSURE KIT DEVI: 1.0000 | 0 refills | Status: DC

## 2022-01-24 MED ORDER — PRENATAL 28-0.8 MG PO TABS: 1.0000 | ORAL_TABLET | Freq: Every day | ORAL | 12 refills | Status: AC

## 2022-01-24 NOTE — Progress Notes (Signed)
New OB Intake  I connected withNAME@ on 01/24/22 at  1:10 PM EST by telephone Visit and verified that I am speaking with the correct person using two identifiers. Nurse is located at [facility] and pt is located at Home.  I discussed the limitations, risks, security and privacy concerns of performing an evaluation and management service by telephone and the availability of in person appointments. I also discussed with the patient that there may be a patient responsible charge related to this service. The patient expressed understanding and agreed to proceed.  I explained I am completing New OB Intake today. We discussed EDD of 08/02/22 that is based on LMP of 10/26/21. Pt is G10/P3. I reviewed her allergies, medications, Medical/Surgical/OB history, and appropriate screenings. I informed her of [facility] services. [facility] information placed in AVS. Based on history, this is a high risk pregnancy.  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 Patient given information for [facility] Healthy Baby website for more information about [facility]. Patient is not interested in water birth. Offered upcoming OB visit with CNM to discuss further.  MyChart/Babyscripts MyChart access verified. I explained pt will have some visits in office and some virtually. Babyscripts instructions given and order placed. Patient verifies receipt of registration text/e-mail. Account successfully created and app downloaded.  Blood Pressure Cuff/Weight Scale Blood pressure cuff ordered for patient to pick-up from [employer]. Explained after first prenatal appt pt will check weekly and document in Babyscripts. Patient does not have weight scale; patient may purchase if they desire to track weight weekly  in Babyscripts.  Anatomy US Explained first scheduled [place] will be around 19 weeks. Anatomy US scheduled for 19 wks at [facility]. Pt notified to arrive at TBD.  Labs Discussed Avelina Laine genetic screening with patient. Would like both Panorama and Horizon drawn at new OB visit. Routine prenatal labs needed.  COVID Vaccine Patient has not had COVID vaccine.   Social Determinants of Health Food Insecurity: Patient expresses food insecurity. Food Market information given to patient; explained patient may visit at the end of first OB appointment. WIC Referral: Patient is interested in referral to [facility].  Transportation: Patient denies transportation needs. Childcare: Discussed no children allowed at ultrasound appointments. Offered childcare services; patient declines childcare services at this time.  First visit review I reviewed new OB appt with patient. I explained they will have a provider visit that includes pelvic exam and labs. Explained pt will be seen by Dr. Clearance Coots at first visit; encounter routed to appropriate provider. Explained that patient will be seen by pregnancy navigator following visit with provider.   Harrel Lemon, RN 01/24/2022  1:58 PM

## 2022-01-25 ENCOUNTER — Other Ambulatory Visit: Payer: Medicaid Other

## 2022-01-25 ENCOUNTER — Ambulatory Visit (INDEPENDENT_AMBULATORY_CARE_PROVIDER_SITE_OTHER): Payer: Medicaid Other

## 2022-01-25 ENCOUNTER — Ambulatory Visit (INDEPENDENT_AMBULATORY_CARE_PROVIDER_SITE_OTHER): Payer: Medicaid Other | Admitting: Advanced Practice Midwife

## 2022-01-25 ENCOUNTER — Other Ambulatory Visit: Payer: Self-pay | Admitting: Obstetrics & Gynecology

## 2022-01-25 DIAGNOSIS — O3680X0 Pregnancy with inconclusive fetal viability, not applicable or unspecified: Secondary | ICD-10-CM | POA: Diagnosis not present

## 2022-01-25 DIAGNOSIS — Z3491 Encounter for supervision of normal pregnancy, unspecified, first trimester: Secondary | ICD-10-CM | POA: Diagnosis not present

## 2022-01-25 DIAGNOSIS — Z3A13 13 weeks gestation of pregnancy: Secondary | ICD-10-CM

## 2022-01-25 DIAGNOSIS — O0990 Supervision of high risk pregnancy, unspecified, unspecified trimester: Secondary | ICD-10-CM | POA: Diagnosis not present

## 2022-01-25 NOTE — Progress Notes (Signed)
Ultrasounds Results Note  SUBJECTIVE HPI:  Ms. Deborah Brock is a 36 y.o. [id number] at [redacted]w[redacted]d by uncertain  LMP who presents to [facility] for followup ultrasound results. The patient denies abdominal pain or vaginal bleeding.  Upon review of the patient's records, patient was first seen in MAU on 11/27/21 for abd pain.     Previous HCG's  Latest Reference Range & Units 11/27/21 15:23 12/07/21 17:15  HCG, Beta Chain, Quant, S <5 mIU/mL 639 (H) 17,283 (H)  (H): Data is abnormally high  Previous US 12/07/21: GS, YS, no FP   O pos  Repeat ultrasound was performed earlier today.   Past Medical History:  Diagnosis Date   Depression    Hypertension    Past Surgical History:  Procedure Laterality Date   brazillian butt lift  11/2017   BREAST SURGERY     Augmentation 10/2013   DIAGNOSTIC LAPAROSCOPY WITH REMOVAL OF ECTOPIC PREGNANCY Right 01/07/2020   Procedure: DIAGNOSTIC LAPAROSCOPY WITH REMOVAL OF ECTOPIC PREGNANCY;  Surgeon: Edwinna Areola, DO;  Location: [facility] OR;  Service: Gynecology;  Laterality: Right;   LIPOSUCTION  11/2017   THERAPEUTIC ABORTION     May 2016   TONSILLECTOMY     Social History   Socioeconomic History   Marital status: Single    Spouse name: Not on file   Number of children: Not on file   Years of education: Not on file   Highest education level: Not on file  Occupational History   Not on file  Tobacco Use   Smoking status: Former    Types: Cigarettes    Quit date: 07/2021    Years since quitting: 0.5   Smokeless tobacco: Never  Vaping Use   Vaping Use: Never used  Substance and Sexual Activity   Alcohol use: No    Comment: socially   Drug use: Not Currently    Types: Marijuana    Comment: every other day   Sexual activity: Yes    Birth control/protection: None  Other Topics Concern   Not on file  Social History Narrative   Not on file   Social Determinants of Health   Financial Resource Strain: Not on file  Food  Insecurity: Not on file  Transportation Needs: Not on file  Physical Activity: Not on file  Stress: Not on file  Social Connections: Not on file  Intimate Partner Violence: Not on file   Current Outpatient Medications on File Prior to Visit  Medication Sig Dispense Refill   acetaminophen (TYLENOL) 325 MG tablet Take 2 tablets (650 mg total) by mouth every 4 (four) hours as needed for moderate pain. 40 tablet 1   Blood Pressure Monitoring (BLOOD PRESSURE KIT) DEVI 1 Device by Does not apply route once a week. 1 each 0   Prenatal 28-0.8 MG TABS Take 1 tablet by mouth daily. 30 tablet 12   Prenatal Vit-Fe Fumarate-FA (PRENATAL MULTIVITAMIN) TABS tablet Take 1 tablet by mouth daily at 12 noon.     promethazine (PHENERGAN) 25 MG tablet Take 1 tablet (25 mg total) by mouth every 6 (six) hours as needed for nausea or vomiting. 30 tablet 1   No current facility-administered medications on file prior to visit.   No Known Allergies  I have reviewed patient's Past Medical Hx, Surgical Hx, Family Hx, Social Hx, medications and allergies.   Review of Systems Review of Systems  Constitutional: Negative for fever and chills.  Gastrointestinal: Negative for abdominal pain.  Genitourinary: Negative  for vaginal bleeding.  Musculoskeletal: Negative for back pain.  Neurological: Negative for dizziness and weakness.    Physical Exam  LMP 10/26/2021 (Approximate)   Patient's last menstrual period was 10/26/2021 (approximate). GENERAL: Well-developed, well-nourished female in no acute distress.  HEENT: Normocephalic, atraumatic.   LUNGS: Effort normal ABDOMEN: Deferred HEART: Regular rate  SKIN: Warm, dry and without erythema PSYCH: Normal mood and affect NEURO: Alert and oriented x 4  LAB RESULTS No results found for this or any previous visit (from the past 24 hour(s)).  IMAGING 13.2 week live SIUP  ASSESSMENT 1. Supervision of high risk pregnancy, antepartum   2. [redacted] weeks gestation of  pregnancy   3. Normal IUP (intrauterine pregnancy) on prenatal ultrasound, first trimester     PLAN NOB at [facility] 02/24/22 Wants to do CenteringPregnancy Patient advised to start/continue taking prenatal vitamins Go to MAU as needed for heavy bleeding, abdominal pain or fever greater than 100.4.  [place], CNM 01/25/2022 9:30 AM

## 2022-01-25 NOTE — Patient Instructions (Addendum)
  CenteringPregnancy is a model of prenatal care that started 30 years ago and is used in about 600 practices around the [place]. You meet with a group of 8-12 women due around the same time as you. In Centering you will have individual time with the provider and meet as a group. There's much more time for discussion and learning. You will actually have much more time with your provider in Centering than in traditional prenatal care.? You will come directly into the Centering room and will not wait in the lobby so there is no wasted time. You will have 2-hour visits every 4 weeks then every 2 weeks. You will know your Centering prenatal appointments in advance. In your last month of pregnancy, you may also come in for some individual visits. Additional appointments can be scheduled if you need more care. Studies have shown that CenteringPregnancy improves birth outcomes. We have seen especially big improvements in fewer Black women delivering babies who are too small or born too early. Visit the website CenteringHealthcare for more information. Let your provider or clinic staff know if you want to sign up.    

## 2022-01-26 DIAGNOSIS — Z2839 Other underimmunization status: Secondary | ICD-10-CM | POA: Insufficient documentation

## 2022-01-26 LAB — COMPREHENSIVE METABOLIC PANEL
ALT: 11 IU/L (ref 0–32)
AST: 13 IU/L (ref 0–40)
Albumin/Globulin Ratio: 1.7 (ref 1.2–2.2)
Albumin: 3.7 g/dL — ABNORMAL LOW (ref 3.9–4.9)
Alkaline Phosphatase: 56 IU/L (ref 44–121)
BUN/Creatinine Ratio: 13 (ref 9–23)
BUN: 7 mg/dL (ref 6–20)
Bilirubin Total: <0.2 mg/dL (ref 0.0–1.2)
CO2: 19 mmol/L — ABNORMAL LOW (ref 20–29)
Calcium: 9.3 mg/dL (ref 8.7–10.2)
Chloride: 105 mmol/L (ref 96–106)
Creatinine, Ser: 0.53 mg/dL — ABNORMAL LOW (ref 0.57–1.00)
Globulin, Total: 2.2 g/dL (ref 1.5–4.5)
Glucose: 88 mg/dL (ref 70–99)
Potassium: 4.2 mmol/L (ref 3.5–5.2)
Sodium: 138 mmol/L (ref 134–144)
Total Protein: 5.9 g/dL — ABNORMAL LOW (ref 6.0–8.5)
eGFR: 124 mL/min/{1.73_m2} (ref >59)

## 2022-01-26 LAB — CBC/D/PLT+RPR+RH+ABO+RUBIGG...
Antibody Screen: NEGATIVE
Basophils Absolute: 0.1 10*3/uL (ref 0.0–0.2)
Basos: 1 %
EOS (ABSOLUTE): 0.2 10*3/uL (ref 0.0–0.4)
Eos: 2 %
HCV Ab: NONREACTIVE
HIV Screen 4th Generation wRfx: NONREACTIVE
Hematocrit: 37.2 % (ref 34.0–46.6)
Hemoglobin: 12.6 g/dL (ref 11.1–15.9)
Hepatitis B Surface Ag: NEGATIVE
Immature Grans (Abs): 0.1 10*3/uL (ref 0.0–0.1)
Immature Granulocytes: 1 %
Lymphocytes Absolute: 2.3 10*3/uL (ref 0.7–3.1)
Lymphs: 20 %
MCH: 28.4 pg (ref 26.6–33.0)
MCHC: 33.9 g/dL (ref 31.5–35.7)
MCV: 84 fL (ref 79–97)
Monocytes Absolute: 0.6 10*3/uL (ref 0.1–0.9)
Monocytes: 6 %
Neutrophils Absolute: 7.9 10*3/uL — ABNORMAL HIGH (ref 1.4–7.0)
Neutrophils: 70 %
Platelets: 347 10*3/uL (ref 150–450)
RBC: 4.44 x10E6/uL (ref 3.77–5.28)
RDW: 12.7 % (ref 11.7–15.4)
RPR Ser Ql: NONREACTIVE
Rh Factor: POSITIVE
Rubella Antibodies, IGG: <0.9 index — ABNORMAL LOW (ref >0.99)
WBC: 11.2 10*3/uL — ABNORMAL HIGH (ref 3.4–10.8)

## 2022-01-26 LAB — HCV INTERPRETATION

## 2022-01-30 LAB — PANORAMA PRENATAL TEST FULL PANEL:PANORAMA TEST PLUS 5 ADDITIONAL MICRODELETIONS: FETAL FRACTION: 9.5

## 2022-01-31 ENCOUNTER — Ambulatory Visit
Admission: EM | Admit: 2022-01-31 | Discharge: 2022-01-31 | Disposition: A | Discharge status: Home / Self Care | Payer: Medicaid Other | Attending: Urgent Care | Admitting: Urgent Care

## 2022-01-31 ENCOUNTER — Encounter: Payer: Self-pay | Admitting: *Deleted

## 2022-01-31 ENCOUNTER — Encounter: Payer: Self-pay | Admitting: Obstetrics

## 2022-01-31 DIAGNOSIS — K047 Periapical abscess without sinus: Secondary | ICD-10-CM

## 2022-01-31 DIAGNOSIS — K0889 Other specified disorders of teeth and supporting structures: Secondary | ICD-10-CM | POA: Diagnosis not present

## 2022-01-31 DIAGNOSIS — Z3A14 14 weeks gestation of pregnancy: Secondary | ICD-10-CM | POA: Diagnosis not present

## 2022-01-31 MED ORDER — ACETAMINOPHEN 325 MG PO TABS: 650.0000 mg | ORAL_TABLET | Freq: Four times a day (QID) | ORAL | 0 refills | Status: DC | PRN

## 2022-01-31 MED ORDER — AMOXICILLIN 875 MG PO TABS: 875.0000 mg | ORAL_TABLET | Freq: Two times a day (BID) | ORAL | 0 refills | Status: DC

## 2022-01-31 MED ORDER — CHLORHEXIDINE GLUCONATE 0.12 % MT SOLN: OROMUCOSAL | 0 refills | Status: DC

## 2022-01-31 MED ORDER — ACETAMINOPHEN 325 MG PO TABS: 650.0000 mg | ORAL_TABLET | Freq: Once | ORAL | Status: DC

## 2022-01-31 NOTE — ED Provider Notes (Signed)
[facility] - URGENT CARE CENTER  Note:  This document was prepared using Systems analyst and may include unintentional dictation errors.  MRN: [id number] DOB: 07/30/1985  Subjective:   Deborah Brock is a 36 y.o. female presenting for 1 month history of acute on chronic dental pain.  Patient has felt this both on the left upper side and the right lower side.  Has not followed up with a dental specialist.  Feels a knot and swelling over the right lower side of her face.  Has previously been advised that she needs to get the scan.  This order was placed over the summer but patient did not pursue it.  No current facility-administered medications for this encounter.  Current Outpatient Medications:    acetaminophen (TYLENOL) 325 MG tablet, Take 2 tablets (650 mg total) by mouth every 4 (four) hours as needed for moderate pain., Disp: 40 tablet, Rfl: 1   Blood Pressure Monitoring (BLOOD PRESSURE KIT) DEVI, 1 Device by Does not apply route once a week., Disp: 1 each, Rfl: 0   Prenatal 28-0.8 MG TABS, Take 1 tablet by mouth daily., Disp: 30 tablet, Rfl: 12   Prenatal Vit-Fe Fumarate-FA (PRENATAL MULTIVITAMIN) TABS tablet, Take 1 tablet by mouth daily at 12 noon., Disp: , Rfl:    promethazine (PHENERGAN) 25 MG tablet, Take 1 tablet (25 mg total) by mouth every 6 (six) hours as needed for nausea or vomiting., Disp: 30 tablet, Rfl: 1   No Known Allergies  Past Medical History:  Diagnosis Date   Depression    Hypertension      Past Surgical History:  Procedure Laterality Date   brazillian butt lift  11/2017   BREAST SURGERY     Augmentation 10/2013   DIAGNOSTIC LAPAROSCOPY WITH REMOVAL OF ECTOPIC PREGNANCY Right 01/07/2020   Procedure: DIAGNOSTIC LAPAROSCOPY WITH REMOVAL OF ECTOPIC PREGNANCY;  Surgeon: Sherlyn Hay, DO;  Location: [facility];  Service: Gynecology;  Laterality: Right;   LIPOSUCTION  11/2017   THERAPEUTIC ABORTION     May 2016   TONSILLECTOMY       Family History  Problem Relation Age of Onset   Cancer Paternal Grandmother    Cancer Maternal Grandmother    Hypertension Maternal Grandmother    Diabetes Maternal Grandmother    Hypertension Mother    Diabetes Mother    Bipolar disorder Mother    Post-traumatic stress disorder Mother     Social History   Tobacco Use   Smoking status: Former    Types: Cigarettes    Quit date: 07/2021    Years since quitting: 0.5   Smokeless tobacco: Never  Vaping Use   Vaping Use: Never used  Substance Use Topics   Alcohol use: No   Drug use: Not Currently    Types: Marijuana    ROS   Objective:   Vitals: BP 126/87 (BP Location: Right Arm)   Pulse 89   Temp 97.9 F (36.6 C) (Oral)   Resp 18   LMP 10/26/2021 (Approximate)   SpO2 98%   Physical Exam Constitutional:      General: She is not in acute distress.    Appearance: Normal appearance. She is well-developed. She is not ill-appearing, toxic-appearing or diaphoretic.  HENT:     Head: Normocephalic and atraumatic.     Nose: Nose normal.     Mouth/Throat:     Mouth: Mucous membranes are moist.     Pharynx: No pharyngeal swelling, oropharyngeal exudate, posterior oropharyngeal erythema or  uvula swelling.     Tonsils: No tonsillar exudate or tonsillar abscesses. 0 on the right. 0 on the left.   Eyes:     General: No scleral icterus.       Right eye: No discharge.        Left eye: No discharge.     Extraocular Movements: Extraocular movements intact.  Cardiovascular:     Rate and Rhythm: Normal rate.  Pulmonary:     Effort: Pulmonary effort is normal.  Skin:    General: Skin is warm and dry.  Neurological:     General: No focal deficit present.     Mental Status: She is alert and oriented to person, place, and time.  Psychiatric:        Mood and Affect: Mood normal.        Behavior: Behavior normal.     Assessment and Plan :   PDMP not reviewed this encounter.  1. Dental infection   2. [redacted] weeks  gestation of pregnancy   3. Pain, dental     Recommended follow-up with her previous provider to make sure that they revisit whether or not she should have imaging done for the right lower side of her face and lymph nodes.  Otherwise we will manage her dental pain for dental infection and gingivitis with amoxicillin and chlorhexidine rinse.  Recommended use of Tylenol.  Follow-up with a dental specialist. Counseled patient on potential for adverse effects with medications prescribed/recommended today, ER and return-to-clinic precautions discussed, patient verbalized understanding.    Jaynee Eagles, [place] 02/01/22 [phone]

## 2022-01-31 NOTE — ED Triage Notes (Addendum)
Pt c/o upper dental pain x 1 month-also c/o "knot" to right jaw line x "months"-states she is using advil and tylenol-NAD-steady gait-pt is [redacted] weeks pregnant

## 2022-01-31 NOTE — Discharge Instructions (Addendum)
Urgent Tooth °Emergency dental service in [place], [place] °Address: [address], [place], [place] [address] °Phone: [phone] ° °[facility] °[phone] extension [phone] °[address] ° °[place]. [place] °[phone] °[address] ° °[employer] °[phone] °[address] ° °[facility] °[phone] extension [phone] °[address] [place], [place], [address] °First come first serve for the first 10 clients.  May do simple extractions only, no wisdom teeth or surgery.  You may try the second for Thursday of the month starting at 6:30 AM. ° °[employer] °You may call the school to see if they are still helping to provide dental care for emergent cases. ° °

## 2022-02-02 LAB — HORIZON CUSTOM: REPORT SUMMARY: POSITIVE — AB

## 2022-02-03 ENCOUNTER — Encounter: Payer: Self-pay | Admitting: Obstetrics and Gynecology

## 2022-02-03 DIAGNOSIS — D563 Thalassemia minor: Secondary | ICD-10-CM | POA: Insufficient documentation

## 2022-02-14 NOTE — L&D Delivery Note (Signed)
LABOR COURSE Patient was admitted for IOL as indicated by A1GDM. She was AROM'd and augmented with Pitocin. She progressed to complete without further intervention.   Delivery Note Called to room and patient was complete and pushing. Head delivered direct OA. No nuchal cord present per SNM assessment. No spontaneous restitution. No progression of delivery with moderate maternal effort. Restitation to ROA. Posterior arm delivered by CNM 1 minute after delivery of fetal head. Shoulder and body delivered in usual fashion. At 1018 a viable female was delivered via Vaginal, Spontaneous (Presentation:OA, ROA).  Infant with spontaneous cry, placed on mother's abdomen, dried and stimulated. Cord clamped x 2 after 3-minute delay, and cut by FOB. Cord blood drawn. Placenta delivered spontaneously with gentle cord traction. Appears intact. Fundus firm with massage and Pitocin. Labia, perineum, vagina, and cervix inspected.    APGAR: 8, 9; weight: 3600 g  .   Cord: 3VC with the following complications:N/A.    Anesthesia: Epidural  Episiotomy: None Lacerations: None Blood Loss (mL): 162  Mom to postpartum.  Baby to Couplet care / Skin to Skin.  Clayton Bibles, CNM 07/26/22 12:18 PM

## 2022-02-22 ENCOUNTER — Telehealth: Payer: Self-pay

## 2022-02-22 ENCOUNTER — Encounter: Payer: Self-pay | Admitting: Obstetrics

## 2022-02-22 NOTE — Telephone Encounter (Signed)
Returned call, pt stated that she already spoke with someone

## 2022-02-24 ENCOUNTER — Other Ambulatory Visit (HOSPITAL_COMMUNITY)
Admission: RE | Admit: 2022-02-24 | Discharge: 2022-02-24 | Disposition: A | Discharge status: Home / Self Care | Payer: Medicaid Other | Source: Ambulatory Visit | Attending: Obstetrics | Admitting: Obstetrics

## 2022-02-24 ENCOUNTER — Encounter: Payer: Self-pay | Admitting: Obstetrics

## 2022-02-24 ENCOUNTER — Ambulatory Visit (INDEPENDENT_AMBULATORY_CARE_PROVIDER_SITE_OTHER): Payer: Medicaid Other | Admitting: Obstetrics

## 2022-02-24 ENCOUNTER — Ambulatory Visit (INDEPENDENT_AMBULATORY_CARE_PROVIDER_SITE_OTHER): Payer: Medicaid Other | Admitting: Licensed Clinical Social Worker

## 2022-02-24 VITALS — BP 118/74 | HR 84 | Wt 147.8 lb

## 2022-02-24 DIAGNOSIS — O219 Vomiting of pregnancy, unspecified: Secondary | ICD-10-CM

## 2022-02-24 DIAGNOSIS — Z3480 Encounter for supervision of other normal pregnancy, unspecified trimester: Secondary | ICD-10-CM | POA: Diagnosis not present

## 2022-02-24 DIAGNOSIS — Z2839 Other underimmunization status: Secondary | ICD-10-CM | POA: Diagnosis not present

## 2022-02-24 DIAGNOSIS — Z658 Other specified problems related to psychosocial circumstances: Secondary | ICD-10-CM

## 2022-02-24 DIAGNOSIS — O09892 Supervision of other high risk pregnancies, second trimester: Secondary | ICD-10-CM | POA: Diagnosis not present

## 2022-02-24 DIAGNOSIS — Z3482 Encounter for supervision of other normal pregnancy, second trimester: Secondary | ICD-10-CM

## 2022-02-24 DIAGNOSIS — Z23 Encounter for immunization: Secondary | ICD-10-CM

## 2022-02-24 DIAGNOSIS — Z3A17 17 weeks gestation of pregnancy: Secondary | ICD-10-CM

## 2022-02-24 MED ORDER — DOXYLAMINE-PYRIDOXINE 10-10 MG PO TBEC: 2.0000 | DELAYED_RELEASE_TABLET | Freq: Every day | ORAL | 5 refills | Status: DC

## 2022-02-24 NOTE — BH Specialist Note (Signed)
Integrated Behavioral Health Initial In-Person Visit  MRN: [id number] Name:  [name]  Number of [place] Clinician visits: 1 Session Start time: 10:00am   Session End time: 10:12am Total time in minutes: 12 mins via phone   Types of Service: Allison (BHI)  Interpretor:No. Interpretor Name and Language: none   Warm Hand Off Completed.        Subjective: Deborah Brock is a 37 y.o. female accompanied by n/a Patient was referred by office for new ob intro. Patient reports the following symptoms/concerns: no concerns  Duration of problem: n/a; Severity of problem: n/a  Objective: Mood: good  and Affect: Appropriate Risk of harm to self or others: No plan to harm self or others  Life Context: Family and Social: Lives in [place]  School/Work: n/a Self-Care: n/a Life Changes: n/a  Patient and/or Family's Strengths/Protective Factors: Concrete supports in place (healthy food, safe environments, etc.)  Goals Addressed: Patient will: Keep medical and prenatal appt  Prioritize rest  Take prenatal vitamins  Progress towards Goals: Ongoing   Plan: Follow up with behavioral health clinician on : when and as needed  Behavioral recommendations: no recommendations  Referral(s): n/a "From scale of 1-10, how likely are you to follow plan?":    Lynnea Ferrier, LCSW

## 2022-02-24 NOTE — Progress Notes (Signed)
Patient presents for New OB. Patient had labs completed on 12/12. Patient has no concerns today.

## 2022-02-24 NOTE — Progress Notes (Signed)
Subjective:    Deborah Brock is being seen today for her first obstetrical visit.  This is not a planned pregnancy. She is at [redacted]w[redacted]d gestation. Her obstetrical history is significant for  none . Relationship with FOB: significant other, living together. Patient does intend to breast feed. Pregnancy history fully reviewed.  The information documented in the HPI was reviewed and verified.  Menstrual History: OB History     Gravida  10   Para  3   Term  3   Preterm  0   AB  6   Living  3      SAB  3   IAB  2   Ectopic  1   Multiple  0   Live Births  3            Patient's last menstrual period was 10/26/2021 (approximate).    Past Medical History:  Diagnosis Date   Depression    Hypertension     Past Surgical History:  Procedure Laterality Date   brazillian butt lift  11/2017   BREAST SURGERY     Augmentation 10/2013   DIAGNOSTIC LAPAROSCOPY WITH REMOVAL OF ECTOPIC PREGNANCY Right 01/07/2020   Procedure: DIAGNOSTIC LAPAROSCOPY WITH REMOVAL OF ECTOPIC PREGNANCY;  Surgeon: Edwinna Areola, DO;  Location: [facility] OR;  Service: Gynecology;  Laterality: Right;   LIPOSUCTION  11/2017   THERAPEUTIC ABORTION     May 2016   TONSILLECTOMY      (Not in a hospital admission)  No Known Allergies  Social History   Tobacco Use   Smoking status: Former    Types: Cigarettes    Quit date: 07/2021    Years since quitting: 0.6   Smokeless tobacco: Never  Substance Use Topics   Alcohol use: No    Family History  Problem Relation Age of Onset   Cancer Paternal Grandmother    Cancer Maternal Grandmother    Hypertension Maternal Grandmother    Diabetes Maternal Grandmother    Hypertension Mother    Diabetes Mother    Bipolar disorder Mother    Post-traumatic stress disorder Mother      Review of Systems Constitutional: negative for weight loss Gastrointestinal: negative for vomiting Genitourinary:negative for genital lesions and vaginal discharge and  dysuria Musculoskeletal:negative for back pain Behavioral/Psych: negative for abusive relationship, depression, illegal drug usage and tobacco use    Objective:    BP 118/74   Pulse 84   Wt 147 lb 12.8 oz (67 kg)   LMP 10/26/2021 (Approximate)   BMI 27.03 kg/m  General Appearance:    Alert, cooperative, no distress, appears stated age  Head:    Normocephalic, without obvious abnormality, atraumatic  Eyes:    PERRL, conjunctiva/corneas clear, EOM's intact, fundi    benign, both eyes  Ears:    Normal TM's and external ear canals, both ears  Nose:   Nares normal, septum midline, mucosa normal, no drainage    or sinus tenderness  Throat:   Lips, mucosa, and tongue normal; teeth and gums normal  Neck:   Supple, symmetrical, trachea midline, no adenopathy;    thyroid:  no enlargement/tenderness/nodules; no carotid   bruit or JVD  Back:     Symmetric, no curvature, ROM normal, no CVA tenderness  Lungs:     Clear to auscultation bilaterally, respirations unlabored  Chest Wall:    No tenderness or deformity   Heart:    Regular rate and rhythm, S1 and S2 normal, no murmur, rub  or gallop  Breast Exam:    No tenderness, masses, or nipple abnormality  Abdomen:     Soft, non-tender, bowel sounds active all four quadrants,    no masses, no organomegaly  Genitalia:    Normal female without lesion, discharge or tenderness  Extremities:   Extremities normal, atraumatic, no cyanosis or edema  Pulses:   2+ and symmetric all extremities  Skin:   Skin color, texture, turgor normal, no rashes or lesions  Lymph nodes:   Cervical, supraclavicular, and axillary nodes normal  Neurologic:   CNII-XII intact, normal strength, sensation and reflexes    throughout      Lab Review Urine pregnancy test Labs reviewed yes Radiologic studies reviewed no  Assessment:    Pregnancy at [redacted]w[redacted]d weeks    Plan:    1. Encounter for supervision of normal intrauterine pregnancy in multigravida, antepartum Rx: -  Culture, OB Urine - Cytology - PAP( [place]) - Cervicovaginal ancillary only( [place]) - Flu Vaccine QUAD 36+ mos IM (Fluarix, Quad PF) - AFP, Serum, Open Spina Bifida  2. Rubella non-immune status, antepartum - rubella vaccination postpartum  3. Nausea and vomiting during pregnancy Rx: - Doxylamine-Pyridoxine (DICLEGIS) 10-10 MG TBEC; Take 2 tablets by mouth at bedtime. If symptoms persist, add one tablet in the morning and one in the afternoon  Dispense: 100 tablet; Refill: 5     Prenatal vitamins.  Counseling provided regarding continued use of seat belts, cessation of alcohol consumption, smoking or use of illicit drugs; infection precautions i.e., influenza/TDAP immunizations, toxoplasmosis,CMV, parvovirus, listeria and varicella; workplace safety, exercise during pregnancy; routine dental care, safe medications, sexual activity, hot tubs, saunas, pools, travel, caffeine use, fish and methlymercury, potential toxins, hair treatments, varicose veins Weight gain recommendations per IOM guidelines reviewed: underweight/BMI< 18.5--> gain 28 - 40 lbs; normal weight/BMI 18.5 - 24.9--> gain 25 - 35 lbs; overweight/BMI 25 - 29.9--> gain 15 - 25 lbs; obese/BMI >30->gain  11 - 20 lbs Problem list reviewed and updated. FIRST/CF mutation testing/NIPT/QUAD SCREEN/fragile X/Ashkenazi Jewish population testing/Spinal muscular atrophy discussed: requested. Role of ultrasound in pregnancy discussed; fetal survey: requested. Amniocentesis discussed: not indicated.  Meds ordered this encounter  Medications   Doxylamine-Pyridoxine (DICLEGIS) 10-10 MG TBEC    Sig: Take 2 tablets by mouth at bedtime. If symptoms persist, add one tablet in the morning and one in the afternoon    Dispense:  100 tablet    Refill:  5   Orders Placed This Encounter  Procedures   Culture, OB Urine   Flu Vaccine QUAD 36+ mos IM (Fluarix, Quad PF)   AFP, Serum, Open Spina Bifida    Order Specific Question:   Is  patient insulin dependent?    Answer:   No    Order Specific Question:   Weight (lbs)    Answer:   147    Order Specific Question:   Gestational Age (GA), weeks    Answer:   17    Order Specific Question:   Date on which patient was at this [place]    Answer:   02/24/2022    Order Specific Question:   GA Calculation Method    Answer:   LMP    Order Specific Question:   GA Date    Answer:   02/24/2022    Order Specific Question:   Number of fetuses    Answer:   1    Order Specific Question:   Donor egg?    Answer:   N  Follow up in 4 weeks.  I have spent a total of 20 minutes of face-to-face time, excluding clinical staff time, reviewing notes and preparing to see patient, ordering tests and/or medications, and counseling the patient.   Clenton Pare Jodi Mourning MD 1/11/20124

## 2022-02-25 LAB — CERVICOVAGINAL ANCILLARY ONLY
Bacterial Vaginitis (gardnerella): POSITIVE — AB
Candida Glabrata: NEGATIVE
Candida Vaginitis: NEGATIVE
Chlamydia: NEGATIVE
Comment: NEGATIVE
Comment: NEGATIVE
Comment: NEGATIVE
Comment: NEGATIVE
Comment: NEGATIVE
Comment: NORMAL
Neisseria Gonorrhea: NEGATIVE
Trichomonas: NEGATIVE

## 2022-02-26 ENCOUNTER — Other Ambulatory Visit: Payer: Self-pay | Admitting: Obstetrics

## 2022-02-26 DIAGNOSIS — B9689 Other specified bacterial agents as the cause of diseases classified elsewhere: Secondary | ICD-10-CM

## 2022-02-26 LAB — URINE CULTURE, OB REFLEX: Organism ID, Bacteria: NO GROWTH

## 2022-02-26 LAB — CULTURE, OB URINE

## 2022-02-26 MED ORDER — TINIDAZOLE 500 MG PO TABS: 1000.0000 mg | ORAL_TABLET | Freq: Every day | ORAL | 0 refills | Status: DC

## 2022-02-28 ENCOUNTER — Other Ambulatory Visit: Payer: Self-pay

## 2022-02-28 ENCOUNTER — Telehealth: Payer: Self-pay

## 2022-02-28 ENCOUNTER — Telehealth: Payer: Self-pay | Admitting: *Deleted

## 2022-02-28 ENCOUNTER — Other Ambulatory Visit: Payer: Self-pay | Admitting: *Deleted

## 2022-02-28 DIAGNOSIS — O219 Vomiting of pregnancy, unspecified: Secondary | ICD-10-CM

## 2022-02-28 MED ORDER — DICLEGIS 10-10 MG PO TBEC: 2.0000 | DELAYED_RELEASE_TABLET | Freq: Every day | ORAL | 5 refills | Status: DC

## 2022-02-28 MED ORDER — METRONIDAZOLE 500 MG PO TABS: 500.0000 mg | ORAL_TABLET | Freq: Two times a day (BID) | ORAL | 0 refills | Status: DC

## 2022-02-28 NOTE — Progress Notes (Signed)
Flagyl sent to pharmacy as Tinidazole is not covered.  Pt aware.

## 2022-02-28 NOTE — Telephone Encounter (Signed)
Pt returned call to office.  Pt made aware of results and Rx info.

## 2022-02-28 NOTE — Telephone Encounter (Signed)
Attempted to contact about results and rx sent, no answer, left vm and sent mychart message

## 2022-03-01 ENCOUNTER — Other Ambulatory Visit: Payer: Self-pay | Admitting: Obstetrics

## 2022-03-01 LAB — AFP, SERUM, OPEN SPINA BIFIDA
AFP MoM: 1.24
AFP Value: 48.6 ng/mL
Gest. Age on Collection Date: 17 weeks
Maternal Age At EDD: 34.8 yr
OSBR Risk 1 IN: 5748
Test Results:: NEGATIVE
Weight: 147 [lb_av]

## 2022-03-01 LAB — CYTOLOGY - PAP
Comment: NEGATIVE
Diagnosis: NEGATIVE
High risk HPV: NEGATIVE

## 2022-03-10 ENCOUNTER — Emergency Department (HOSPITAL_COMMUNITY): Payer: Medicaid Other

## 2022-03-10 ENCOUNTER — Other Ambulatory Visit: Payer: Self-pay

## 2022-03-10 ENCOUNTER — Emergency Department (HOSPITAL_COMMUNITY)
Admission: EM | Admit: 2022-03-10 | Discharge: 2022-03-10 | Discharge status: Against Medical Advice | Payer: Medicaid Other | Attending: Emergency Medicine | Admitting: Emergency Medicine

## 2022-03-10 ENCOUNTER — Encounter (HOSPITAL_COMMUNITY): Payer: Self-pay | Admitting: Emergency Medicine

## 2022-03-10 DIAGNOSIS — Z5321 Procedure and treatment not carried out due to patient leaving prior to being seen by health care provider: Secondary | ICD-10-CM | POA: Diagnosis not present

## 2022-03-10 DIAGNOSIS — R220 Localized swelling, mass and lump, head: Secondary | ICD-10-CM | POA: Insufficient documentation

## 2022-03-10 LAB — CBC
HCT: 37.8 % (ref 36.0–46.0)
Hemoglobin: 12.1 g/dL (ref 12.0–15.0)
MCH: 28.3 pg (ref 26.0–34.0)
MCHC: 32 g/dL (ref 30.0–36.0)
MCV: 88.3 fL (ref 80.0–100.0)
Platelets: 347 10*3/uL (ref 150–400)
RBC: 4.28 MIL/uL (ref 3.87–5.11)
RDW: 12.6 % (ref 11.5–15.5)
WBC: 13.4 10*3/uL — ABNORMAL HIGH (ref 4.0–10.5)
nRBC: 0 % (ref 0.0–0.2)

## 2022-03-10 LAB — BASIC METABOLIC PANEL
Anion gap: 10 (ref 5–15)
BUN: 5 mg/dL — ABNORMAL LOW (ref 6–20)
CO2: 23 mmol/L (ref 22–32)
Calcium: 9 mg/dL (ref 8.9–10.3)
Chloride: 105 mmol/L (ref 98–111)
Creatinine, Ser: 0.46 mg/dL (ref 0.44–1.00)
GFR, Estimated: >60 mL/min (ref >60)
Glucose, Bld: 85 mg/dL (ref 70–99)
Potassium: 3.7 mmol/L (ref 3.5–5.1)
Sodium: 138 mmol/L (ref 135–145)

## 2022-03-10 MED ORDER — IOHEXOL 350 MG/ML SOLN
75.0000 mL | Freq: Once | INTRAVENOUS | Status: AC | PRN
  Administered 2022-03-10: 75 mL via INTRAVENOUS

## 2022-03-10 NOTE — ED Notes (Signed)
Pt stated she was leaving. Informed patient about risks of leaving and patient verbalized understanding. IV removed.

## 2022-03-10 NOTE — ED Provider Triage Note (Signed)
Emergency Medicine Provider Triage Evaluation Note  Deborah Brock , a 37 y.o. female  was evaluated in triage.  Pt complains of a mass in her right lower jaw since February last year.  She states she got an ultrasound and was recommended CT scan however she has not had it done.  Denies fever, difficulty speaking or swallowing.  No fluid, blood or pus drainage.  Review of Systems  Positive: As above Negative: As above  Physical Exam  BP 122/86 (BP Location: Right Arm)   Pulse 78   Temp 99.3 F (37.4 C) (Oral)   Resp 18   LMP 10/26/2021 (Approximate)   SpO2 100%  Gen:   Awake, no distress   Resp:  Normal effort  MSK:   Moves extremities without difficulty  Other:  1 cm palpable mass on the right lower jaw.  Medical Decision Making  Medically screening exam initiated at 2:20 PM.  Appropriate orders placed.  Deborah Brock was informed that the remainder of the evaluation will be completed by another provider, this initial triage assessment does not replace that evaluation, and the importance of remaining in the ED until their evaluation is complete.     Rex Kras, [place] 03/10/22 1422

## 2022-03-10 NOTE — ED Notes (Signed)
Patient left without being seen, states she has to pick up child. Sort RN removed IV.

## 2022-03-10 NOTE — ED Triage Notes (Signed)
Pt reports palpable mass on her right jaw. Pt reports that the left side of her neck and left jaw is painful.

## 2022-03-21 ENCOUNTER — Telehealth: Payer: Self-pay | Admitting: Family Medicine

## 2022-03-21 NOTE — Telephone Encounter (Signed)
Patient would like to know about results from last week.

## 2022-03-22 NOTE — Telephone Encounter (Signed)
Pt called requesting results of Neck CT that was done in ED on 03/10/2022. Pt stated she left before she could get the results. I informed pt I was not able to interpret CT and physician who ordered the CT is not a part of our practice. I encouraged pt to discuss results with her provider tomorrow (02/07) during her centering appointment. Pt verbalized understanding and denied further questions.

## 2022-03-23 ENCOUNTER — Ambulatory Visit (INDEPENDENT_AMBULATORY_CARE_PROVIDER_SITE_OTHER): Payer: Medicaid Other | Admitting: Certified Nurse Midwife

## 2022-03-23 ENCOUNTER — Other Ambulatory Visit: Payer: Self-pay

## 2022-03-23 VITALS — BP 118/70 | HR 85 | Wt 159.0 lb

## 2022-03-23 DIAGNOSIS — O0992 Supervision of high risk pregnancy, unspecified, second trimester: Secondary | ICD-10-CM

## 2022-03-23 DIAGNOSIS — R599 Enlarged lymph nodes, unspecified: Secondary | ICD-10-CM

## 2022-03-23 DIAGNOSIS — Z3A21 21 weeks gestation of pregnancy: Secondary | ICD-10-CM

## 2022-03-23 NOTE — Progress Notes (Signed)
   PRENATAL VISIT NOTE  Subjective:  Deborah Brock is a 37 y.o. [id number] at [redacted]w[redacted]d being seen today for ongoing prenatal care.  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  Patient reports no complaints.   . Vag. Bleeding: None.  Movement: Present. Denies leaking of fluid.   The following portions of the patient's history were reviewed and updated as appropriate: allergies, current medications, past family history, past medical history, past social history, past surgical history and problem list.   Objective:   Vitals:   03/23/22 0915  BP: 118/70  Pulse: 85  Weight: 159 lb (72.1 kg)   Fetal Status: Fetal Heart Rate (bpm): 150   Movement: Present     General:  Alert, oriented and cooperative. Patient is in no acute distress.  Skin: Skin is warm and dry. No rash noted.   Cardiovascular: Normal heart rate noted  Respiratory: Normal respiratory effort, no problems with respiration noted  Abdomen: Soft, gravid, appropriate for gestational age.  Pain/Pressure: Absent     Pelvic: Cervical exam deferred        Extremities: Normal range of motion.     Mental Status: Normal mood and affect. Normal behavior. Normal judgment and thought content.   Assessment and Plan:  Pregnancy: [id number] at [redacted]w[redacted]d 1. Supervision of high risk pregnancy in second trimester - Doing well, feeling regular and vigorous fetal movement   2. [redacted] weeks gestation of pregnancy - Routine OB care   3. Swollen lymph nodes - Reviewed results of CT and reassured pt that they found swollen lymph nodes NOT cancer. Advised she follow up with ENT as recommended by the ED doc.    Session 2: Mindfulness - 3 Centering Breaths and Dyad Opening  - Review shallow vs deep breathing, ground  exercises, mantra development/positive affirmations, gratitude practice - Encouraged to journal/record progress in notebook - My Body Knows What to Do Closing - Updated group text thread   Preterm labor symptoms and general obstetric precautions including but not limited to vaginal bleeding, contractions, leaking of fluid and fetal movement were reviewed in detail with the patient. Please refer to After Visit Summary for other counseling recommendations.   Return in about 4 weeks (around 04/20/2022) for LOB, CENTERING, IN-PERSON.  Future Appointments  Date Time Provider [place]  04/07/2022 10:15 AM [facility] NURSE [facility]  04/07/2022 10:30 AM [facility] US3 [facility]  04/20/2022  9:00 AM CENTERING PROVIDER [facility]  05/18/2022  9:00 AM CENTERING PROVIDER [facility]  06/01/2022  9:00 AM CENTERING PROVIDER [facility]  06/15/2022  9:00 AM CENTERING PROVIDER [facility]  06/29/2022  9:00 AM CENTERING PROVIDER [facility]  07/13/2022  9:00 AM CENTERING PROVIDER [facility]  07/27/2022  9:00 AM CENTERING PROVIDER [facility]  08/10/2022  9:00 AM CENTERING PROVIDER [facility] [place] Jesi Jurgens, CNM

## 2022-04-04 DIAGNOSIS — E041 Nontoxic single thyroid nodule: Secondary | ICD-10-CM | POA: Insufficient documentation

## 2022-04-05 ENCOUNTER — Other Ambulatory Visit: Payer: Self-pay | Admitting: Otolaryngology

## 2022-04-05 DIAGNOSIS — E041 Nontoxic single thyroid nodule: Secondary | ICD-10-CM

## 2022-04-07 ENCOUNTER — Encounter: Payer: Self-pay | Admitting: *Deleted

## 2022-04-07 ENCOUNTER — Ambulatory Visit: Payer: Medicaid Other | Admitting: *Deleted

## 2022-04-07 ENCOUNTER — Ambulatory Visit: Payer: Medicaid Other | Attending: Obstetrics and Gynecology

## 2022-04-07 VITALS — BP 112/66 | HR 87

## 2022-04-07 DIAGNOSIS — D563 Thalassemia minor: Secondary | ICD-10-CM

## 2022-04-07 DIAGNOSIS — O09899 Supervision of other high risk pregnancies, unspecified trimester: Secondary | ICD-10-CM | POA: Insufficient documentation

## 2022-04-07 DIAGNOSIS — O2622 Pregnancy care for patient with recurrent pregnancy loss, second trimester: Secondary | ICD-10-CM | POA: Diagnosis not present

## 2022-04-07 DIAGNOSIS — Z2839 Other underimmunization status: Secondary | ICD-10-CM

## 2022-04-07 DIAGNOSIS — O0990 Supervision of high risk pregnancy, unspecified, unspecified trimester: Secondary | ICD-10-CM | POA: Insufficient documentation

## 2022-04-07 DIAGNOSIS — Z3A23 23 weeks gestation of pregnancy: Secondary | ICD-10-CM

## 2022-04-13 ENCOUNTER — Ambulatory Visit
Admission: RE | Admit: 2022-04-13 | Discharge: 2022-04-13 | Disposition: A | Discharge status: Home / Self Care | Payer: Medicaid Other | Source: Ambulatory Visit | Attending: Otolaryngology | Admitting: Otolaryngology

## 2022-04-13 DIAGNOSIS — E041 Nontoxic single thyroid nodule: Secondary | ICD-10-CM

## 2022-04-20 ENCOUNTER — Ambulatory Visit: Payer: Medicaid Other | Admitting: Certified Nurse Midwife

## 2022-04-20 ENCOUNTER — Other Ambulatory Visit: Payer: Self-pay

## 2022-04-20 ENCOUNTER — Telehealth: Payer: Self-pay | Admitting: Family Medicine

## 2022-04-20 ENCOUNTER — Telehealth: Payer: Self-pay | Admitting: Advanced Practice Midwife

## 2022-04-20 DIAGNOSIS — O0990 Supervision of high risk pregnancy, unspecified, unspecified trimester: Secondary | ICD-10-CM

## 2022-04-20 DIAGNOSIS — Z3A25 25 weeks gestation of pregnancy: Secondary | ICD-10-CM

## 2022-04-20 DIAGNOSIS — O0992 Supervision of high risk pregnancy, unspecified, second trimester: Secondary | ICD-10-CM

## 2022-04-20 NOTE — Telephone Encounter (Signed)
Patient said Centering does not work with her schedule with her children and needs to switch out.

## 2022-04-20 NOTE — Telephone Encounter (Signed)
Patient would like a call to get a better understanding of the Centering program

## 2022-04-21 ENCOUNTER — Encounter: Payer: Self-pay | Admitting: Family Medicine

## 2022-04-21 ENCOUNTER — Ambulatory Visit (INDEPENDENT_AMBULATORY_CARE_PROVIDER_SITE_OTHER): Payer: Medicaid Other | Admitting: Family Medicine

## 2022-04-21 ENCOUNTER — Other Ambulatory Visit: Payer: Self-pay

## 2022-04-21 VITALS — BP 89/56 | HR 90 | Wt 163.0 lb

## 2022-04-21 DIAGNOSIS — Z3A25 25 weeks gestation of pregnancy: Secondary | ICD-10-CM

## 2022-04-21 DIAGNOSIS — O09899 Supervision of other high risk pregnancies, unspecified trimester: Secondary | ICD-10-CM

## 2022-04-21 DIAGNOSIS — O0990 Supervision of high risk pregnancy, unspecified, unspecified trimester: Secondary | ICD-10-CM

## 2022-04-21 DIAGNOSIS — O98512 Other viral diseases complicating pregnancy, second trimester: Secondary | ICD-10-CM

## 2022-04-21 DIAGNOSIS — E041 Nontoxic single thyroid nodule: Secondary | ICD-10-CM

## 2022-04-21 DIAGNOSIS — O09892 Supervision of other high risk pregnancies, second trimester: Secondary | ICD-10-CM

## 2022-04-21 DIAGNOSIS — Z2839 Other underimmunization status: Secondary | ICD-10-CM

## 2022-04-21 DIAGNOSIS — B009 Herpesviral infection, unspecified: Secondary | ICD-10-CM

## 2022-04-21 DIAGNOSIS — O0992 Supervision of high risk pregnancy, unspecified, second trimester: Secondary | ICD-10-CM

## 2022-04-21 DIAGNOSIS — D563 Thalassemia minor: Secondary | ICD-10-CM

## 2022-04-21 NOTE — Progress Notes (Signed)
   PRENATAL VISIT NOTE  Subjective:  Deborah Brock is a 37 y.o. Deborah Brock at 36w2dbeing seen today for ongoing prenatal care.  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  Patient reports  pelvic pressure .  Contractions: Irritability. Vag. Bleeding: None.  Movement: Present. Denies leaking of fluid.   The following portions of the patient's history were reviewed and updated as appropriate: allergies, current medications, past family history, past medical history, past social history, past surgical history and problem list.   Objective:   Vitals:   04/21/22 0853  BP: (!) 89/56  Pulse: 90  Weight: 163 lb (73.9 kg)    Fetal Status: Fetal Heart Rate (bpm): 156   Movement: Present     General:  Alert, oriented and cooperative. Patient is in no acute distress.  Skin: Skin is warm and dry. No rash noted.   Cardiovascular: Normal heart rate noted  Respiratory: Normal respiratory effort, no problems with respiration noted  Abdomen: Soft, gravid, appropriate for gestational age.  Pain/Pressure: Present     Pelvic: Cervical exam deferred        Extremities: Normal range of motion.  Edema: None  Mental Status: Normal mood and affect. Normal behavior. Normal judgment and thought content.   Assessment and Plan:  Pregnancy: [id number] 252w2d. Supervision of high risk pregnancy, antepartum Continue routine prenatal care.  2. Rubella non-immune status, antepartum Need MMR pp  3. Herpes virus infection in mother during second trimester of pregnancy Will need ppx at 34-36 weeks  4. Alpha thalassemia silent carrier Has had genetic counseling, does not want partner testing right now  5. Thyroid nodule Following with ENT  Preterm  labor symptoms and general obstetric precautions including but not limited to vaginal bleeding, contractions, leaking of fluid and fetal movement were reviewed in detail with the patient. Please refer to After Visit Summary for other counseling recommendations.   Return in 3 weeks (on 05/12/2022) [facility] 28 wk [facility] Future Appointments  Date Time Provider [place]4/03/2022  8:15 AM [facility]ancy [facility]roy[facility]20[facility]  8:50 AM [facility] LAB [facility]    TaDonnamae JudeMD

## 2022-04-22 NOTE — Progress Notes (Signed)
Did not come to Centering for the 2nd time (not sequentially). Gaylan Gerold, CNM, MSN, [place] Certified Nurse Midwife, [facility]

## 2022-04-26 NOTE — Telephone Encounter (Signed)
See phone call note from 04/26/22 Staci Acosta

## 2022-04-26 NOTE — Telephone Encounter (Signed)
Per chart review she has already cancelled her centeringpregnancy prenatal visits and enrolled into traditional care. I called and we discussed she felt when she missed her appointments and was questioned that she was in trouble. We discussed I am sure they did not mean to make her feel that way and I am sorry that it did but  that we try to make sure patients understand centeringpregnancy is your prenatal care and we don't want you to miss your prenatal care.  She states she plans to just keep her regular appointments not centering. I thanked her for talking with me. Shadiyah Wernli,RN

## 2022-05-17 ENCOUNTER — Encounter: Payer: Self-pay | Admitting: Obstetrics and Gynecology

## 2022-05-17 ENCOUNTER — Other Ambulatory Visit: Payer: Self-pay

## 2022-05-17 ENCOUNTER — Ambulatory Visit (INDEPENDENT_AMBULATORY_CARE_PROVIDER_SITE_OTHER): Payer: Medicaid Other | Admitting: Obstetrics and Gynecology

## 2022-05-17 ENCOUNTER — Other Ambulatory Visit: Payer: Medicaid Other

## 2022-05-17 VITALS — BP 108/75 | HR 61 | Wt 168.0 lb

## 2022-05-17 DIAGNOSIS — O0990 Supervision of high risk pregnancy, unspecified, unspecified trimester: Secondary | ICD-10-CM

## 2022-05-17 DIAGNOSIS — O98513 Other viral diseases complicating pregnancy, third trimester: Secondary | ICD-10-CM

## 2022-05-17 DIAGNOSIS — Z3A29 29 weeks gestation of pregnancy: Secondary | ICD-10-CM

## 2022-05-17 DIAGNOSIS — Z23 Encounter for immunization: Secondary | ICD-10-CM

## 2022-05-17 DIAGNOSIS — O0993 Supervision of high risk pregnancy, unspecified, third trimester: Secondary | ICD-10-CM

## 2022-05-17 DIAGNOSIS — O09893 Supervision of other high risk pregnancies, third trimester: Secondary | ICD-10-CM

## 2022-05-17 DIAGNOSIS — B009 Herpesviral infection, unspecified: Secondary | ICD-10-CM

## 2022-05-17 DIAGNOSIS — E041 Nontoxic single thyroid nodule: Secondary | ICD-10-CM

## 2022-05-17 DIAGNOSIS — D563 Thalassemia minor: Secondary | ICD-10-CM

## 2022-05-17 DIAGNOSIS — Z2839 Other underimmunization status: Secondary | ICD-10-CM

## 2022-05-17 NOTE — Patient Instructions (Signed)

## 2022-05-17 NOTE — Progress Notes (Signed)
Subjective:  Deborah Brock is a 37 y.o. [id number] at [redacted]w[redacted]d being seen today for ongoing prenatal care.  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  Patient reports  general discomforts of pregnancy .  Contractions: Irregular. Vag. Bleeding: None.  Movement: Present. Denies leaking of fluid.   The following portions of the patient's history were reviewed and updated as appropriate: allergies, current medications, past family history, past medical history, past social history, past surgical history and problem list. Problem list updated.  Objective:   Vitals:   05/17/22 0816  BP: 108/75  Pulse: 61  Weight: 168 lb (76.2 kg)    Fetal Status: Fetal Heart Rate (bpm): 154   Movement: Present     General:  Alert, oriented and cooperative. Patient is in no acute distress.  Skin: Skin is warm and dry. No rash noted.   Cardiovascular: Normal heart rate noted  Respiratory: Normal respiratory effort, no problems with respiration noted  Abdomen: Soft, gravid, appropriate for gestational age. Pain/Pressure: Present     Pelvic:  Cervical exam performed        Extremities: Normal range of motion.  Edema: None  Mental Status: Normal mood and affect. Normal behavior. Normal judgment and thought content.   Urinalysis:      Assessment and Plan:  Pregnancy: [id number] at [redacted]w[redacted]d  1. Need for Tdap vaccination  - Tdap vaccine greater than or equal to 7yo IM  2. Supervision of high risk pregnancy, antepartum Stable Glucola today  3. Rubella non-immune status, antepartum Vaccine PP  4. Herpes virus infection in mother during second trimester of pregnancy Suppression at 36 weeks  5. Alpha thalassemia silent carrier Stable  6. Thyroid  nodule Followed by ENT  Preterm labor symptoms and general obstetric precautions including but not limited to vaginal bleeding, contractions, leaking of fluid and fetal movement were reviewed in detail with the patient. Please refer to After Visit Summary for other counseling recommendations.  Return in about 2 weeks (around 05/31/2022) for OB visit, face to face, any provider.   Chancy Milroy, MD

## 2022-05-18 ENCOUNTER — Other Ambulatory Visit: Payer: Medicaid Other

## 2022-05-18 LAB — CBC
Hematocrit: 35.5 % (ref 34.0–46.6)
Hemoglobin: 11.7 g/dL (ref 11.1–15.9)
MCH: 28 pg (ref 26.6–33.0)
MCHC: 33 g/dL (ref 31.5–35.7)
MCV: 85 fL (ref 79–97)
Platelets: 304 10*3/uL (ref 150–450)
RBC: 4.18 x10E6/uL (ref 3.77–5.28)
RDW: 11.8 % (ref 11.7–15.4)
WBC: 14.4 10*3/uL — ABNORMAL HIGH (ref 3.4–10.8)

## 2022-05-18 LAB — GLUCOSE TOLERANCE, 2 HOURS W/ 1HR
Glucose, 1 hour: 182 mg/dL — ABNORMAL HIGH (ref 70–179)
Glucose, 2 hour: 95 mg/dL (ref 70–152)
Glucose, Fasting: 87 mg/dL (ref 70–91)

## 2022-05-18 LAB — RPR: RPR Ser Ql: NONREACTIVE

## 2022-05-18 LAB — HIV ANTIBODY (ROUTINE TESTING W REFLEX): HIV Screen 4th Generation wRfx: NONREACTIVE

## 2022-05-24 ENCOUNTER — Encounter: Payer: Self-pay | Admitting: Certified Nurse Midwife

## 2022-05-24 ENCOUNTER — Other Ambulatory Visit: Payer: Self-pay | Admitting: Certified Nurse Midwife

## 2022-05-24 DIAGNOSIS — O24410 Gestational diabetes mellitus in pregnancy, diet controlled: Secondary | ICD-10-CM

## 2022-05-24 DIAGNOSIS — O24419 Gestational diabetes mellitus in pregnancy, unspecified control: Secondary | ICD-10-CM | POA: Insufficient documentation

## 2022-05-24 MED ORDER — ACCU-CHEK GUIDE VI STRP: ORAL_STRIP | 12 refills | Status: DC

## 2022-05-24 MED ORDER — ACCU-CHEK SOFTCLIX LANCETS MISC: 12 refills | Status: DC

## 2022-05-24 MED ORDER — ACCU-CHEK GUIDE W/DEVICE KIT: 1.0000 | PACK | Freq: Once | 0 refills | Status: AC

## 2022-05-26 ENCOUNTER — Inpatient Hospital Stay (HOSPITAL_COMMUNITY)
Admission: AD | Admit: 2022-05-26 | Discharge: 2022-05-26 | Disposition: A | Discharge status: Home / Self Care | Payer: Medicaid Other | Attending: Obstetrics and Gynecology | Admitting: Obstetrics and Gynecology

## 2022-05-26 ENCOUNTER — Encounter (HOSPITAL_COMMUNITY): Payer: Self-pay | Admitting: Obstetrics and Gynecology

## 2022-05-26 DIAGNOSIS — O98813 Other maternal infectious and parasitic diseases complicating pregnancy, third trimester: Secondary | ICD-10-CM | POA: Insufficient documentation

## 2022-05-26 DIAGNOSIS — B3731 Acute candidiasis of vulva and vagina: Secondary | ICD-10-CM | POA: Insufficient documentation

## 2022-05-26 DIAGNOSIS — O26893 Other specified pregnancy related conditions, third trimester: Secondary | ICD-10-CM | POA: Diagnosis present

## 2022-05-26 DIAGNOSIS — Z3A30 30 weeks gestation of pregnancy: Secondary | ICD-10-CM | POA: Diagnosis not present

## 2022-05-26 DIAGNOSIS — Z0371 Encounter for suspected problem with amniotic cavity and membrane ruled out: Secondary | ICD-10-CM | POA: Diagnosis not present

## 2022-05-26 DIAGNOSIS — O98819 Other maternal infectious and parasitic diseases complicating pregnancy, unspecified trimester: Secondary | ICD-10-CM

## 2022-05-26 HISTORY — DX: Type 2 diabetes mellitus without complications: E11.9

## 2022-05-26 LAB — POCT FERN TEST: POCT Fern Test: NEGATIVE

## 2022-05-26 LAB — WET PREP, GENITAL
Clue Cells Wet Prep HPF POC: NONE SEEN
Sperm: NONE SEEN
Trich, Wet Prep: NONE SEEN
WBC, Wet Prep HPF POC: >=10 — AB (ref <10)
Yeast Wet Prep HPF POC: NONE SEEN

## 2022-05-26 MED ORDER — TERCONAZOLE 0.4 % VA CREA: 1.0000 | TOPICAL_CREAM | Freq: Every day | VAGINAL | 0 refills | Status: DC

## 2022-05-26 NOTE — MAU Note (Signed)
.  Deborah Brock is a 37 y.o. at [redacted]w[redacted]d here in MAU reporting: Deborah Brock underwear had been very wet several times since last night. Denies any pain or cramping . Good fetal movement felt. Stated she had Deborah Brock amniotic sac ruprture 3 weeks early with Deborah Brock last pregnancy.  LMP:  Onset of complaint: last night Pain score: 0 There were no vitals filed for this visit.   FHT:146 Lab orders placed from triage:

## 2022-05-26 NOTE — Discharge Instructions (Signed)

## 2022-05-26 NOTE — MAU Provider Note (Signed)
Event Date/Time   First Provider Initiated Contact with Patient 05/26/22 1805     S: Ms. Deborah Brock is a 37 y.o. [id number] at [redacted]w[redacted]d  who presents to MAU today complaining of leaking of fluid since last night. She denies vaginal bleeding. She denies contractions. She reports normal fetal movement.    O: BP 123/77   Pulse 95   Temp 98.6 F (37 C)   Resp 18   Ht 5\' 2"  (1.575 m)   Wt 76.2 kg   LMP 10/26/2021 (Approximate)   SpO2 98%   BMI 30.73 kg/m  GENERAL: Well-developed, well-nourished female in no acute distress.  HEAD: Normocephalic, atraumatic.  CHEST: Normal effort of breathing, regular heart rate ABDOMEN: Soft, nontender, gravid PELVIC: Normal external female genitalia. Vagina is pink and rugated. Cervix with normal contour, no lesions. Thick white adherent discharge.  No pooling.   Fetal Monitoring: Baseline: 140 Variability: moderate Accelerations: 15x15 Decelerations: none Contractions: none  Results for orders placed or performed during the hospital encounter of 05/26/22 (from the past 24 hour(s))  Wet prep, genital     Status: Abnormal   Collection Time: 05/26/22  6:12 PM   Specimen: Vaginal  Result Value Ref Range   Yeast Wet Prep HPF POC NONE SEEN NONE SEEN   Trich, Wet Prep NONE SEEN NONE SEEN   Clue Cells Wet Prep HPF POC NONE SEEN NONE SEEN   WBC, Wet Prep HPF POC >=10 (A) <10   Sperm NONE SEEN   Fern Test     Status: Normal   Collection Time: 05/26/22  6:33 PM  Result Value Ref Range   POCT Fern Test Negative = intact amniotic membranes      A: 1. Encounter for suspected premature rupture of amniotic membranes, with rupture of membranes not found   2. [redacted] weeks gestation of pregnancy   3. Candidiasis of vagina during pregnancy    -Clinically indicative of yeast. Will treat  P: -Discharge home in stable condition -Rx for terazol sent to pharmacy -Third trimester precautions discussed -Patient advised to follow-up with OB as scheduled for  prenatal care -Patient may return to MAU as needed or if her condition were to change or worsen  Rolm Bookbinder, CNM 05/26/2022, 6:05 PM

## 2022-05-31 ENCOUNTER — Other Ambulatory Visit: Payer: Self-pay

## 2022-05-31 ENCOUNTER — Ambulatory Visit (INDEPENDENT_AMBULATORY_CARE_PROVIDER_SITE_OTHER): Payer: Medicaid Other | Admitting: Family Medicine

## 2022-05-31 VITALS — BP 110/74 | HR 97 | Wt 170.0 lb

## 2022-05-31 DIAGNOSIS — O0990 Supervision of high risk pregnancy, unspecified, unspecified trimester: Secondary | ICD-10-CM

## 2022-05-31 DIAGNOSIS — Z3A31 31 weeks gestation of pregnancy: Secondary | ICD-10-CM

## 2022-05-31 DIAGNOSIS — O24410 Gestational diabetes mellitus in pregnancy, diet controlled: Secondary | ICD-10-CM

## 2022-05-31 DIAGNOSIS — O0993 Supervision of high risk pregnancy, unspecified, third trimester: Secondary | ICD-10-CM

## 2022-05-31 DIAGNOSIS — Z0371 Encounter for suspected problem with amniotic cavity and membrane ruled out: Secondary | ICD-10-CM

## 2022-05-31 DIAGNOSIS — O98513 Other viral diseases complicating pregnancy, third trimester: Secondary | ICD-10-CM

## 2022-05-31 DIAGNOSIS — B009 Herpesviral infection, unspecified: Secondary | ICD-10-CM

## 2022-05-31 NOTE — Progress Notes (Deleted)
The following learning objectives were met by the patient during this course:   States the definition of Gestational Diabetes States why dietary management is important in controlling blood glucose Describes the effects each nutrient has on blood glucose levels Demonstrates ability to create a balanced meal plan Demonstrates carbohydrate counting  States when to check blood glucose levels Demonstrates proper blood glucose monitoring techniques States the effect of stress and exercise on blood glucose levels States the importance of limiting caffeine and abstaining from alcohol and smoking   Blood glucose monitor given: None. Patient has meter and checking blood sugar prior to class.   Patient instructed to monitor glucose levels: FBS: 60 - ? 95 mg/dL; 1 hr: <140 OR 2 hr: <120   Patient received handouts: Nutrition Diabetes and Pregnancy, including carb counting list and glucose log sheet   Patient will be seen for follow-up as needed. 

## 2022-06-01 ENCOUNTER — Ambulatory Visit: Payer: Medicaid Other

## 2022-06-04 NOTE — Progress Notes (Signed)
   PRENATAL VISIT NOTE  Subjective:  Deborah Brock is a 37 y.o. [id number] at [redacted]w[redacted]d being seen today for ongoing prenatal care.  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  Patient reports no bleeding, no contractions, no cramping, and no leaking.  Contractions: Not present. Vag. Bleeding: None.  Movement: Present. Denies leaking of fluid.   The following portions of the patient's history were reviewed and updated as appropriate: allergies, current medications, past family history, past medical history, past social history, past surgical history and problem list.   Objective:   Vitals:   05/31/22 1603  BP: 110/74  Pulse: 97  Weight: 170 lb (77.1 kg)    Fetal Status: Fetal Heart Rate (bpm): 155   Movement: Present     General:  Alert, oriented and cooperative. Patient is in no acute distress.  Skin: Skin is warm and dry. No rash noted.   Cardiovascular: Normal heart rate noted  Respiratory: Normal respiratory effort, no problems with respiration noted  Abdomen: Soft, gravid, appropriate for gestational age.  Pain/Pressure: Absent     Pelvic: Cervical exam deferred        Extremities: Normal range of motion.  Edema: Trace  Mental Status: Normal mood and affect. Normal behavior. Normal judgment and thought content.   Assessment and Plan:  Pregnancy: [id number] at [redacted]w[redacted]d 1. Supervision of high risk pregnancy, antepartum Continue routine prenatal care.  2. Diet controlled gestational diabetes mellitus (GDM) in third trimester Patient did not bring blood glucose log to today's visit.  Reports that her fasting blood sugars are in the 1 teens and 2-hour postprandial are 120s.  She is only had her blood glucose  meter for 2 days.  Reports that she does snack at night.  Recommended meeting with a diabetes educator and testing 4 times a day.  She will follow-up in 1 week and bring her blood glucose log and may require medications at that time.  3. [redacted] weeks gestation of pregnancy  4. Herpes virus infection in mother during second trimester of pregnancy Patient will need suppression at 36 weeks  Preterm labor symptoms and general obstetric precautions including but not limited to vaginal bleeding, contractions, leaking of fluid and fetal movement were reviewed in detail with the patient. Please refer to After Visit Summary for other counseling recommendations.   No follow-ups on file.  Future Appointments  Date Time Provider Department Center  06/09/2022  1:55 PM Nobie Putnam, Cyndi Lennert, MD [facility]    Celedonio Savage, MD

## 2022-06-09 ENCOUNTER — Ambulatory Visit (INDEPENDENT_AMBULATORY_CARE_PROVIDER_SITE_OTHER): Payer: Medicaid Other | Admitting: Family Medicine

## 2022-06-09 ENCOUNTER — Other Ambulatory Visit: Payer: Self-pay

## 2022-06-09 VITALS — BP 101/66 | HR 103 | Wt 173.0 lb

## 2022-06-09 DIAGNOSIS — B009 Herpesviral infection, unspecified: Secondary | ICD-10-CM

## 2022-06-09 DIAGNOSIS — O0990 Supervision of high risk pregnancy, unspecified, unspecified trimester: Secondary | ICD-10-CM

## 2022-06-09 DIAGNOSIS — O98512 Other viral diseases complicating pregnancy, second trimester: Secondary | ICD-10-CM

## 2022-06-09 DIAGNOSIS — O0993 Supervision of high risk pregnancy, unspecified, third trimester: Secondary | ICD-10-CM

## 2022-06-09 DIAGNOSIS — O98513 Other viral diseases complicating pregnancy, third trimester: Secondary | ICD-10-CM

## 2022-06-09 DIAGNOSIS — Z3A32 32 weeks gestation of pregnancy: Secondary | ICD-10-CM

## 2022-06-09 DIAGNOSIS — O24410 Gestational diabetes mellitus in pregnancy, diet controlled: Secondary | ICD-10-CM

## 2022-06-09 NOTE — Progress Notes (Signed)
   PRENATAL VISIT NOTE  Subjective:  Deborah Brock is a 37 y.o. [id number] at [redacted]w[redacted]d being seen today for ongoing prenatal care.  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  Patient reports no bleeding, no contractions, no cramping, and no leaking.  Contractions: Not present. Vag. Bleeding: None.  Movement: Present. Denies leaking of fluid.   The following portions of the patient's history were reviewed and updated as appropriate: allergies, current medications, past family history, past medical history, past social history, past surgical history and problem list.   Objective:   Vitals:   06/09/22 1341  BP: 101/66  Pulse: (!) 103  Weight: 173 lb (78.5 kg)    Fetal Status: Fetal Heart Rate (bpm): 163   Movement: Present     General:  Alert, oriented and cooperative. Patient is in no acute distress.  Skin: Skin is warm and dry. No rash noted.   Cardiovascular: Normal heart rate noted  Respiratory: Normal respiratory effort, no problems with respiration noted  Abdomen: Soft, gravid, appropriate for gestational age.  Pain/Pressure: Present     Pelvic: Cervical exam deferred        Extremities: Normal range of motion.  Edema: None  Mental Status: Normal mood and affect. Normal behavior. Normal judgment and thought content.   Assessment and Plan:  Pregnancy: [id number] at [redacted]w[redacted]d 1. Supervision of high risk pregnancy, antepartum Continue routine prenatal care  2. Diet controlled gestational diabetes mellitus (GDM) in third trimester Patient with elevated fasting blood glucoses.  Reports that a number of them may have not been a full 8 hours and she may have woken up and had a chip or some snack in the middle of  the night.  Discussed the importance of knowing what her fasting blood glucoses are to determine need for medication.  Patient agrees to truly fast and follow-up next week.  If fasting blood sugars remain elevated will start patient on metformin - [place] MFM OB FOLLOW UP; Future  3. Herpes virus infection in mother during second trimester of pregnancy Will need prophylaxis starting 34 to 36 weeks  4. [redacted] weeks gestation of pregnancy   Preterm labor symptoms and general obstetric precautions including but not limited to vaginal bleeding, contractions, leaking of fluid and fetal movement were reviewed in detail with the patient. Please refer to After Visit Summary for other counseling recommendations.   No follow-ups on file.  Future Appointments  Date Time Provider Department Center  06/30/2022  1:30 PM [facility] NURSE [facility]  06/30/2022  1:45 PM [facility] US4 [facility]    Celedonio Savage, MD

## 2022-06-14 ENCOUNTER — Other Ambulatory Visit: Payer: Self-pay

## 2022-06-14 ENCOUNTER — Ambulatory Visit (INDEPENDENT_AMBULATORY_CARE_PROVIDER_SITE_OTHER): Payer: Medicaid Other | Admitting: Family Medicine

## 2022-06-14 VITALS — BP 117/77 | HR 95 | Wt 172.3 lb

## 2022-06-14 DIAGNOSIS — O0990 Supervision of high risk pregnancy, unspecified, unspecified trimester: Secondary | ICD-10-CM

## 2022-06-14 DIAGNOSIS — O0993 Supervision of high risk pregnancy, unspecified, third trimester: Secondary | ICD-10-CM

## 2022-06-14 DIAGNOSIS — B009 Herpesviral infection, unspecified: Secondary | ICD-10-CM

## 2022-06-14 DIAGNOSIS — O98513 Other viral diseases complicating pregnancy, third trimester: Secondary | ICD-10-CM

## 2022-06-14 DIAGNOSIS — Z3A33 33 weeks gestation of pregnancy: Secondary | ICD-10-CM

## 2022-06-14 DIAGNOSIS — O24410 Gestational diabetes mellitus in pregnancy, diet controlled: Secondary | ICD-10-CM

## 2022-06-14 NOTE — Progress Notes (Signed)
   PRENATAL VISIT NOTE  Subjective:  Deborah Brock is a 37 y.o. [id number] at [redacted]w[redacted]d being seen today for ongoing prenatal care.  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  Patient reports no bleeding, no contractions, and no cramping.   . Vag. Bleeding: None.  Movement: Present. Denies leaking of fluid.   The following portions of the patient's history were reviewed and updated as appropriate: allergies, current medications, past family history, past medical history, past social history, past surgical history and problem list.   Objective:   Vitals:   06/14/22 1545  BP: 117/77  Pulse: 95  Weight: 172 lb 4.8 oz (78.2 kg)    Fetal Status: Fetal Heart Rate (bpm): 144   Movement: Present     General:  Alert, oriented and cooperative. Patient is in no acute distress.  Skin: Skin is warm and dry. No rash noted.   Cardiovascular: Normal heart rate noted  Respiratory: Normal respiratory effort, no problems with respiration noted  Abdomen: Soft, gravid, appropriate for gestational age.  Pain/Pressure: Present     Pelvic: Cervical exam deferred        Extremities: Normal range of motion.  Edema: None  Mental Status: Normal mood and affect. Normal behavior. Normal judgment and thought content.    Assessment and Plan:  Pregnancy: [id number] at [redacted]w[redacted]d 1. Supervision of high risk pregnancy, antepartum Continue routine monitoring.  2. Diet controlled gestational diabetes mellitus (GDM) in third trimester Patient with blood sugar log.  Less than one third of blood sugars above acceptable limit.  Will continue to monitor and consider initiation of medications if needed at future visits.  Discussed dietary changes and  regular monitoring of blood sugars and patient is agreeable  3. Herpes virus infection in mother during second trimester of pregnancy Patient will need prophylaxis starting 34-36 weeks.  Will send prescription today.  Preterm labor symptoms and general obstetric precautions including but not limited to vaginal bleeding, contractions, leaking of fluid and fetal movement were reviewed in detail with the patient. Please refer to After Visit Summary for other counseling recommendations.   No follow-ups on file.  Future Appointments  Date Time Provider Department Center  06/28/2022 11:15 AM Warden Fillers, MD [facility] [place]  06/30/2022  1:30 PM [facility] NURSE [facility]  06/30/2022  1:45 PM [facility] US4 [facility]    Celedonio Savage, MD

## 2022-06-17 MED ORDER — VALACYCLOVIR HCL 500 MG PO TABS
500.0000 mg | ORAL_TABLET | Freq: Two times a day (BID) | ORAL | 3 refills | Status: DC
Start: 2022-06-17 — End: 2022-07-27

## 2022-06-28 ENCOUNTER — Ambulatory Visit (INDEPENDENT_AMBULATORY_CARE_PROVIDER_SITE_OTHER): Payer: Medicaid Other | Admitting: Obstetrics and Gynecology

## 2022-06-28 ENCOUNTER — Other Ambulatory Visit: Payer: Self-pay

## 2022-06-28 VITALS — BP 113/70 | HR 106 | Wt 179.1 lb

## 2022-06-28 DIAGNOSIS — O24410 Gestational diabetes mellitus in pregnancy, diet controlled: Secondary | ICD-10-CM

## 2022-06-28 DIAGNOSIS — B009 Herpesviral infection, unspecified: Secondary | ICD-10-CM

## 2022-06-28 DIAGNOSIS — D563 Thalassemia minor: Secondary | ICD-10-CM

## 2022-06-28 DIAGNOSIS — O09899 Supervision of other high risk pregnancies, unspecified trimester: Secondary | ICD-10-CM

## 2022-06-28 DIAGNOSIS — Z3A35 35 weeks gestation of pregnancy: Secondary | ICD-10-CM

## 2022-06-28 DIAGNOSIS — O98513 Other viral diseases complicating pregnancy, third trimester: Secondary | ICD-10-CM

## 2022-06-28 DIAGNOSIS — O0990 Supervision of high risk pregnancy, unspecified, unspecified trimester: Secondary | ICD-10-CM

## 2022-06-28 DIAGNOSIS — Z2839 Other underimmunization status: Secondary | ICD-10-CM

## 2022-06-28 NOTE — Progress Notes (Signed)
   PRENATAL VISIT NOTE  Subjective:  Deborah Brock is a 37 y.o. [id number] at [redacted]w[redacted]d being seen today for ongoing prenatal care.  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  Patient doing well with no acute concerns today. She reports occasional contractions.  Contractions: Irritability. Vag. Bleeding: None.  Movement: Present. Denies leaking of fluid.   The following portions of the patient's history were reviewed and updated as appropriate: allergies, current medications, past family history, past medical history, past social history, past surgical history and problem list. Problem list updated.  Objective:   Vitals:   06/28/22 1109  BP: 113/70  Pulse: (!) 106  Weight: 179 lb 1.6 oz (81.2 kg)    Fetal Status: Fetal Heart Rate (bpm): 155 Fundal Height: 34 cm Movement: Present     General:  Alert, oriented and cooperative. Patient is in no acute distress.  Skin: Skin is warm and dry. No rash noted.   Cardiovascular: Normal heart rate noted  Respiratory: Normal respiratory effort, no problems with respiration noted  Abdomen: Soft, gravid, appropriate for gestational age.  Pain/Pressure: Present     Pelvic: Cervical exam performed Dilation: 1.5 Effacement (%): 70 Station: -3  Extremities: Normal range of motion.  Edema: Trace  Mental Status:  Normal mood and affect. Normal behavior. Normal judgment and thought content.   Assessment and Plan:  Pregnancy: [id number] at [redacted]w[redacted]d  1. [redacted] weeks gestation of pregnancy   2. Diet controlled gestational diabetes mellitus (GDM) in third trimester FBS: 93-98 PPBS: 111-120  Decent blood sugar control Delivery at 39 weeks unless status changes  3. Supervision of high  risk pregnancy, antepartum Continue routine prenatal care  4. Rubella non-immune status, antepartum Treat after delivery  5. Herpes virus infection in mother during third trimester of pregnancy Prophylaxis at 36 weeks  6. Alpha thalassemia silent carrier   Preterm labor symptoms and general obstetric precautions including but not limited to vaginal bleeding, contractions, leaking of fluid and fetal movement were reviewed in detail with the patient.  Please refer to After Visit Summary for other counseling recommendations.   Return in about 1 week (around 07/05/2022) for [facility], in person, 36 weeks swabs.   Mariel Aloe, MD Faculty Attending Center for [facility]

## 2022-06-28 NOTE — Progress Notes (Signed)
Pt did not bring Glucose Log. 

## 2022-06-30 ENCOUNTER — Ambulatory Visit: Payer: Medicaid Other

## 2022-06-30 ENCOUNTER — Ambulatory Visit: Payer: Medicaid Other | Attending: Family Medicine

## 2022-06-30 VITALS — BP 109/73 | HR 101

## 2022-06-30 DIAGNOSIS — O0990 Supervision of high risk pregnancy, unspecified, unspecified trimester: Secondary | ICD-10-CM | POA: Insufficient documentation

## 2022-06-30 DIAGNOSIS — O285 Abnormal chromosomal and genetic finding on antenatal screening of mother: Secondary | ICD-10-CM

## 2022-06-30 DIAGNOSIS — O2623 Pregnancy care for patient with recurrent pregnancy loss, third trimester: Secondary | ICD-10-CM | POA: Diagnosis not present

## 2022-06-30 DIAGNOSIS — D563 Thalassemia minor: Secondary | ICD-10-CM | POA: Diagnosis not present

## 2022-06-30 DIAGNOSIS — O24410 Gestational diabetes mellitus in pregnancy, diet controlled: Secondary | ICD-10-CM

## 2022-06-30 DIAGNOSIS — O09899 Supervision of other high risk pregnancies, unspecified trimester: Secondary | ICD-10-CM | POA: Insufficient documentation

## 2022-06-30 DIAGNOSIS — Z2839 Other underimmunization status: Secondary | ICD-10-CM

## 2022-06-30 DIAGNOSIS — Z3A35 35 weeks gestation of pregnancy: Secondary | ICD-10-CM

## 2022-07-04 ENCOUNTER — Other Ambulatory Visit: Payer: Self-pay

## 2022-07-04 ENCOUNTER — Other Ambulatory Visit (HOSPITAL_COMMUNITY)
Admission: RE | Admit: 2022-07-04 | Discharge: 2022-07-04 | Disposition: A | Discharge status: Home / Self Care | Payer: Medicaid Other | Source: Ambulatory Visit | Attending: Family Medicine | Admitting: Family Medicine

## 2022-07-04 ENCOUNTER — Ambulatory Visit (INDEPENDENT_AMBULATORY_CARE_PROVIDER_SITE_OTHER): Payer: Medicaid Other | Admitting: Obstetrics & Gynecology

## 2022-07-04 VITALS — BP 113/76 | HR 96 | Wt 177.9 lb

## 2022-07-04 DIAGNOSIS — O0990 Supervision of high risk pregnancy, unspecified, unspecified trimester: Secondary | ICD-10-CM

## 2022-07-04 DIAGNOSIS — O24410 Gestational diabetes mellitus in pregnancy, diet controlled: Secondary | ICD-10-CM

## 2022-07-04 DIAGNOSIS — B009 Herpesviral infection, unspecified: Secondary | ICD-10-CM

## 2022-07-04 DIAGNOSIS — Z3A35 35 weeks gestation of pregnancy: Secondary | ICD-10-CM

## 2022-07-04 DIAGNOSIS — O98513 Other viral diseases complicating pregnancy, third trimester: Secondary | ICD-10-CM

## 2022-07-04 DIAGNOSIS — O0993 Supervision of high risk pregnancy, unspecified, third trimester: Secondary | ICD-10-CM

## 2022-07-04 NOTE — Progress Notes (Signed)
   PRENATAL VISIT NOTE  Subjective:  Deborah Brock is a 37 y.o. [id number] at [redacted]w[redacted]d being seen today for ongoing prenatal care.  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  Patient reports occasional contractions.  Contractions: Irritability. Vag. Bleeding: None.  Movement: Present. Denies leaking of fluid.   The following portions of the patient's history were reviewed and updated as appropriate: allergies, current medications, past family history, past medical history, past social history, past surgical history and problem list.   Objective:   Vitals:   07/04/22 1608  BP: 113/76  Pulse: 96  Weight: 177 lb 14.4 oz (80.7 kg)    Fetal Status: Fetal Heart Rate (bpm): 157   Movement: Present     General:  Alert, oriented and cooperative. Patient is in no acute distress.  Skin: Skin is warm and dry. No rash noted.   Cardiovascular: Normal heart rate noted  Respiratory: Normal respiratory effort, no problems with respiration noted  Abdomen: Soft, gravid, appropriate for gestational age.  Pain/Pressure: Present     Pelvic: Cervical exam performed in the presence of a chaperone Dilation: 2.5 Effacement (%): 70 Station: -3  Extremities: Normal range of motion.  Edema: None  Mental Status: Normal mood and affect. Normal behavior. Normal judgment and thought content.   Assessment and Plan:  Pregnancy: [id number] at [redacted]w[redacted]d 1. Supervision of high risk pregnancy, antepartum Routine 36  week labs - GC/Chlamydia probe amp ([place])not at [facility] - Culture, beta strep (group b only)  2. Diet controlled gestational diabetes mellitus (GDM) in third trimester Diet control is still acceptable   3. Herpes virus infection  in mother during third trimester of pregnancy Taking Valtrex  Preterm labor symptoms and general obstetric precautions including but not limited to vaginal bleeding, contractions, leaking of fluid and fetal movement were reviewed in detail with the patient. Please refer to After Visit Summary for other counseling recommendations.   Return in about 1 week (around 07/11/2022).  No future appointments.  Scheryl Darter, MD

## 2022-07-04 NOTE — Progress Notes (Addendum)
Pt has glucose log on meter that she brought today.

## 2022-07-05 LAB — GC/CHLAMYDIA PROBE AMP (~~LOC~~) NOT AT ARMC
Chlamydia: NEGATIVE
Comment: NEGATIVE
Comment: NORMAL
Neisseria Gonorrhea: NEGATIVE

## 2022-07-07 ENCOUNTER — Encounter: Payer: Medicaid Other | Admitting: Family Medicine

## 2022-07-08 LAB — CULTURE, BETA STREP (GROUP B ONLY): Strep Gp B Culture: NEGATIVE

## 2022-07-13 ENCOUNTER — Encounter: Payer: Medicaid Other | Admitting: Obstetrics & Gynecology

## 2022-07-19 ENCOUNTER — Encounter: Payer: Self-pay | Admitting: Family Medicine

## 2022-07-19 ENCOUNTER — Ambulatory Visit (INDEPENDENT_AMBULATORY_CARE_PROVIDER_SITE_OTHER): Payer: Medicaid Other | Admitting: Family Medicine

## 2022-07-19 ENCOUNTER — Other Ambulatory Visit: Payer: Self-pay

## 2022-07-19 VITALS — BP 120/82 | HR 112 | Wt 180.6 lb

## 2022-07-19 DIAGNOSIS — Z3A38 38 weeks gestation of pregnancy: Secondary | ICD-10-CM

## 2022-07-19 DIAGNOSIS — D563 Thalassemia minor: Secondary | ICD-10-CM

## 2022-07-19 DIAGNOSIS — O0990 Supervision of high risk pregnancy, unspecified, unspecified trimester: Secondary | ICD-10-CM

## 2022-07-19 DIAGNOSIS — O0993 Supervision of high risk pregnancy, unspecified, third trimester: Secondary | ICD-10-CM

## 2022-07-19 DIAGNOSIS — O24410 Gestational diabetes mellitus in pregnancy, diet controlled: Secondary | ICD-10-CM

## 2022-07-19 DIAGNOSIS — B009 Herpesviral infection, unspecified: Secondary | ICD-10-CM

## 2022-07-19 DIAGNOSIS — O98513 Other viral diseases complicating pregnancy, third trimester: Secondary | ICD-10-CM

## 2022-07-19 DIAGNOSIS — O09893 Supervision of other high risk pregnancies, third trimester: Secondary | ICD-10-CM

## 2022-07-19 DIAGNOSIS — Z2839 Other underimmunization status: Secondary | ICD-10-CM

## 2022-07-19 NOTE — Patient Instructions (Signed)

## 2022-07-19 NOTE — Progress Notes (Signed)
   Subjective:  Deborah Brock is a 37 y.o. [id number] at [redacted]w[redacted]d being seen today for ongoing prenatal care.  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  Patient reports no complaints.  Contractions: Regular. Vag. Bleeding: None.  Movement: Present. Denies leaking of fluid.   The following portions of the patient's history were reviewed and updated as appropriate: allergies, current medications, past family history, past medical history, past social history, past surgical history and problem list. Problem list updated.  Objective:   Vitals:   07/19/22 1408  BP: 120/82  Pulse: (!) 112  Weight: 180 lb 9.6 oz (81.9 kg)    Fetal Status: Fetal Heart Rate (bpm): 154   Movement: Present  Presentation: Vertex  General:  Alert, oriented and cooperative. Patient is in no acute distress.  Skin: Skin is warm and dry. No rash noted.   Cardiovascular: Normal heart rate noted  Respiratory: Normal respiratory effort, no problems with respiration noted  Abdomen: Soft, gravid, appropriate for gestational age. Pain/Pressure: Present     Pelvic: Vag. Bleeding: None     Cervical exam performed Dilation: 3 Effacement (%): 80 Station: 0  Extremities: Normal range of motion.  Edema: Trace  Mental Status: Normal mood and affect. Normal behavior. Normal judgment and thought content.   Urinalysis:      Assessment and Plan:  Pregnancy: [id number] at [redacted]w[redacted]d  1. Supervision of high risk pregnancy, antepartum BP and FHR normal Cervix 3/80/0, very favorable  2. Diet controlled gestational diabetes mellitus (GDM) in third trimester Not checking for the past week but reports they have been normal, emphasized importance of checking  regularly Last growth [place] 06/30/22, EFW 72% 2853g, AC 87%, AFI 13 Discussed recommendation for IOL around 39 weeks, scheduled for 07/26/2022 Form faxed, orders placed  3. Alpha thalassemia silent carrier   4. Rubella non-immune status, antepartum Offer MMR PP  5. Herpes virus infection in mother during third trimester of pregnancy Taking valtrex suppresion  Term labor symptoms and general obstetric precautions including but not limited to vaginal bleeding, contractions, leaking of fluid and fetal movement were reviewed in detail with the patient. Please refer to After Visit Summary for other counseling recommendations.  Return in 7 weeks (on 09/06/2022) for PP check.   Venora Maples, MD

## 2022-07-26 ENCOUNTER — Inpatient Hospital Stay (HOSPITAL_COMMUNITY): Payer: Medicaid Other | Admitting: Anesthesiology

## 2022-07-26 ENCOUNTER — Encounter (HOSPITAL_COMMUNITY): Payer: Self-pay

## 2022-07-26 ENCOUNTER — Inpatient Hospital Stay (HOSPITAL_COMMUNITY): Admission: RE | Admit: 2022-07-26 | Payer: Medicaid Other | Source: Ambulatory Visit

## 2022-07-26 ENCOUNTER — Encounter (HOSPITAL_COMMUNITY): Payer: Self-pay | Admitting: Family Medicine

## 2022-07-26 ENCOUNTER — Inpatient Hospital Stay (HOSPITAL_COMMUNITY)
Admission: RE | Admit: 2022-07-26 | Discharge: 2022-07-27 | DRG: 807 | Disposition: A | Discharge status: Home / Self Care | Payer: Medicaid Other | Attending: Family Medicine | Admitting: Family Medicine

## 2022-07-26 ENCOUNTER — Other Ambulatory Visit: Payer: Self-pay

## 2022-07-26 DIAGNOSIS — O0990 Supervision of high risk pregnancy, unspecified, unspecified trimester: Secondary | ICD-10-CM

## 2022-07-26 DIAGNOSIS — O24420 Gestational diabetes mellitus in childbirth, diet controlled: Secondary | ICD-10-CM | POA: Diagnosis present

## 2022-07-26 DIAGNOSIS — Z30017 Encounter for initial prescription of implantable subdermal contraceptive: Secondary | ICD-10-CM | POA: Diagnosis not present

## 2022-07-26 DIAGNOSIS — O24419 Gestational diabetes mellitus in pregnancy, unspecified control: Principal | ICD-10-CM

## 2022-07-26 DIAGNOSIS — O24410 Gestational diabetes mellitus in pregnancy, diet controlled: Secondary | ICD-10-CM

## 2022-07-26 DIAGNOSIS — Z23 Encounter for immunization: Secondary | ICD-10-CM

## 2022-07-26 DIAGNOSIS — Z87891 Personal history of nicotine dependence: Secondary | ICD-10-CM | POA: Diagnosis not present

## 2022-07-26 DIAGNOSIS — Z3A39 39 weeks gestation of pregnancy: Secondary | ICD-10-CM

## 2022-07-26 DIAGNOSIS — O99344 Other mental disorders complicating childbirth: Secondary | ICD-10-CM | POA: Diagnosis not present

## 2022-07-26 DIAGNOSIS — O99214 Obesity complicating childbirth: Secondary | ICD-10-CM | POA: Diagnosis present

## 2022-07-26 LAB — COMPREHENSIVE METABOLIC PANEL
ALT: 11 U/L (ref 0–44)
AST: 20 U/L (ref 15–41)
Albumin: 2.4 g/dL — ABNORMAL LOW (ref 3.5–5.0)
Alkaline Phosphatase: 276 U/L — ABNORMAL HIGH (ref 38–126)
Anion gap: 11 (ref 5–15)
BUN: 9 mg/dL (ref 6–20)
CO2: 17 mmol/L — ABNORMAL LOW (ref 22–32)
Calcium: 8.5 mg/dL — ABNORMAL LOW (ref 8.9–10.3)
Chloride: 105 mmol/L (ref 98–111)
Creatinine, Ser: 0.71 mg/dL (ref 0.44–1.00)
GFR, Estimated: >60 mL/min (ref >60)
Glucose, Bld: 88 mg/dL (ref 70–99)
Potassium: 3.6 mmol/L (ref 3.5–5.1)
Sodium: 133 mmol/L — ABNORMAL LOW (ref 135–145)
Total Bilirubin: 0.4 mg/dL (ref 0.3–1.2)
Total Protein: 6 g/dL — ABNORMAL LOW (ref 6.5–8.1)

## 2022-07-26 LAB — CBC
HCT: 36.5 % (ref 36.0–46.0)
Hemoglobin: 12.1 g/dL (ref 12.0–15.0)
MCH: 27.1 pg (ref 26.0–34.0)
MCHC: 33.2 g/dL (ref 30.0–36.0)
MCV: 81.7 fL (ref 80.0–100.0)
Platelets: 308 10*3/uL (ref 150–400)
RBC: 4.47 MIL/uL (ref 3.87–5.11)
RDW: 13.3 % (ref 11.5–15.5)
WBC: 13.6 10*3/uL — ABNORMAL HIGH (ref 4.0–10.5)
nRBC: 0 % (ref 0.0–0.2)

## 2022-07-26 LAB — PROTEIN / CREATININE RATIO, URINE
Creatinine, Urine: 54 mg/dL
Total Protein, Urine: <6 mg/dL

## 2022-07-26 LAB — TYPE AND SCREEN
ABO/RH(D): O POS
Antibody Screen: NEGATIVE

## 2022-07-26 LAB — RPR: RPR Ser Ql: NONREACTIVE

## 2022-07-26 LAB — GLUCOSE, CAPILLARY
Glucose-Capillary: 98 mg/dL (ref 70–99)
Glucose-Capillary: 92 mg/dL (ref 70–99)

## 2022-07-26 MED ORDER — IBUPROFEN 600 MG PO TABS
600.0000 mg | ORAL_TABLET | Freq: Four times a day (QID) | ORAL | Status: DC
  Administered 2022-07-26 – 2022-07-27 (×4): 600 mg via ORAL
  Filled 2022-07-26 (×4): qty 1

## 2022-07-26 MED ORDER — LIDOCAINE HCL (PF) 1 % IJ SOLN: 30.0000 mL | INTRAMUSCULAR | Status: DC | PRN

## 2022-07-26 MED ORDER — ONDANSETRON HCL 4 MG/2ML IJ SOLN: 4.0000 mg | INTRAMUSCULAR | Status: DC | PRN

## 2022-07-26 MED ORDER — PRENATAL MULTIVITAMIN CH
1.0000 | ORAL_TABLET | Freq: Every day | ORAL | Status: DC
  Administered 2022-07-26 – 2022-07-27 (×2): 1 via ORAL
  Filled 2022-07-26 (×2): qty 1

## 2022-07-26 MED ORDER — LACTATED RINGERS IV SOLN
500.0000 mL | INTRAVENOUS | Status: DC | PRN
  Administered 2022-07-26: 1000 mL via INTRAVENOUS

## 2022-07-26 MED ORDER — COCONUT OIL OIL: 1.0000 | TOPICAL_OIL | Status: DC | PRN

## 2022-07-26 MED ORDER — LACTATED RINGERS IV SOLN: 500.0000 mL | Freq: Once | INTRAVENOUS | Status: DC

## 2022-07-26 MED ORDER — OXYTOCIN-SODIUM CHLORIDE 30-0.9 UT/500ML-% IV SOLN
1.0000 m[IU]/min | INTRAVENOUS | Status: DC
  Administered 2022-07-26: 2 m[IU]/min via INTRAVENOUS

## 2022-07-26 MED ORDER — DIBUCAINE (PERIANAL) 1 % EX OINT: 1.0000 | TOPICAL_OINTMENT | CUTANEOUS | Status: DC | PRN

## 2022-07-26 MED ORDER — MEASLES, MUMPS & RUBELLA VAC IJ SOLR
0.5000 mL | Freq: Once | INTRAMUSCULAR | Status: AC
  Administered 2022-07-27: 0.5 mL via SUBCUTANEOUS
  Filled 2022-07-26: qty 0.5

## 2022-07-26 MED ORDER — EPHEDRINE 5 MG/ML INJ: 10.0000 mg | INTRAVENOUS | Status: DC | PRN

## 2022-07-26 MED ORDER — ACETAMINOPHEN 325 MG PO TABS
650.0000 mg | ORAL_TABLET | ORAL | Status: DC | PRN
  Administered 2022-07-26: 650 mg via ORAL
  Filled 2022-07-26: qty 2

## 2022-07-26 MED ORDER — DIPHENHYDRAMINE HCL 50 MG/ML IJ SOLN
12.5000 mg | INTRAMUSCULAR | Status: DC | PRN
  Administered 2022-07-26: 12.5 mg via INTRAVENOUS
  Filled 2022-07-26: qty 1

## 2022-07-26 MED ORDER — FENTANYL-BUPIVACAINE-NACL 0.5-0.125-0.9 MG/250ML-% EP SOLN
12.0000 mL/h | EPIDURAL | Status: DC | PRN
  Administered 2022-07-26: 12 mL/h via EPIDURAL
  Filled 2022-07-26: qty 250

## 2022-07-26 MED ORDER — WITCH HAZEL-GLYCERIN EX PADS: 1.0000 | MEDICATED_PAD | CUTANEOUS | Status: DC | PRN

## 2022-07-26 MED ORDER — SOD CITRATE-CITRIC ACID 500-334 MG/5ML PO SOLN
30.0000 mL | ORAL | Status: DC | PRN
  Administered 2022-07-26 (×2): 30 mL via ORAL
  Filled 2022-07-26 (×2): qty 30

## 2022-07-26 MED ORDER — FENTANYL CITRATE (PF) 100 MCG/2ML IJ SOLN: 50.0000 ug | INTRAMUSCULAR | Status: DC | PRN

## 2022-07-26 MED ORDER — ONDANSETRON HCL 4 MG PO TABS: 4.0000 mg | ORAL_TABLET | ORAL | Status: DC | PRN

## 2022-07-26 MED ORDER — DIPHENHYDRAMINE HCL 25 MG PO CAPS: 25.0000 mg | ORAL_CAPSULE | Freq: Four times a day (QID) | ORAL | Status: DC | PRN

## 2022-07-26 MED ORDER — LACTATED RINGERS IV SOLN: INTRAVENOUS | Status: DC

## 2022-07-26 MED ORDER — OXYTOCIN BOLUS FROM INFUSION
333.0000 mL | Freq: Once | INTRAVENOUS | Status: AC
  Administered 2022-07-26: 333 mL via INTRAVENOUS

## 2022-07-26 MED ORDER — OXYTOCIN-SODIUM CHLORIDE 30-0.9 UT/500ML-% IV SOLN
2.5000 [IU]/h | INTRAVENOUS | Status: DC
  Filled 2022-07-26: qty 500

## 2022-07-26 MED ORDER — LIDOCAINE HCL (PF) 1 % IJ SOLN
INTRAMUSCULAR | Status: DC | PRN
  Administered 2022-07-26 (×2): 5 mL via EPIDURAL

## 2022-07-26 MED ORDER — BENZOCAINE-MENTHOL 20-0.5 % EX AERO: 1.0000 | INHALATION_SPRAY | CUTANEOUS | Status: DC | PRN

## 2022-07-26 MED ORDER — SENNOSIDES-DOCUSATE SODIUM 8.6-50 MG PO TABS
2.0000 | ORAL_TABLET | Freq: Every day | ORAL | Status: DC
  Administered 2022-07-27: 2 via ORAL
  Filled 2022-07-26: qty 2

## 2022-07-26 MED ORDER — SIMETHICONE 80 MG PO CHEW: 80.0000 mg | CHEWABLE_TABLET | ORAL | Status: DC | PRN

## 2022-07-26 MED ORDER — ONDANSETRON HCL 4 MG/2ML IJ SOLN: 4.0000 mg | Freq: Four times a day (QID) | INTRAMUSCULAR | Status: DC | PRN

## 2022-07-26 MED ORDER — PHENYLEPHRINE 80 MCG/ML (10ML) SYRINGE FOR IV PUSH (FOR BLOOD PRESSURE SUPPORT): 80.0000 ug | PREFILLED_SYRINGE | INTRAVENOUS | Status: DC | PRN

## 2022-07-26 MED ORDER — TERBUTALINE SULFATE 1 MG/ML IJ SOLN: 0.2500 mg | Freq: Once | INTRAMUSCULAR | Status: DC | PRN

## 2022-07-26 NOTE — Discharge Instructions (Signed)

## 2022-07-26 NOTE — Progress Notes (Signed)
Deborah Brock is a 37 y.o. [phone] at [redacted]w[redacted]d by LMP admitted for induction of labor due to A1GDM  Subjective: - comfortable with epidural, starting to feel some pressure -well supported by partner and family  Objective: BP 99/66   Pulse 72   Temp 97.9 F (36.6 C) (Oral)   Resp 17   Ht 5\' 2"  (1.575 m)   Wt 82.6 kg   LMP 10/26/2021 (Approximate)   BMI 33.29 kg/m  I/O last 3 completed shifts: In: 856.1 [I.V.:835.9; Other:20.3] Out: 400 [Urine:400] No intake/output data recorded.  FHT:  FHR: 130 bpm, variability: moderate,  accelerations:  Present,  decelerations:  Absent UC:   regular, every 1.5-3 minutes SVE:   Dilation: 8 Effacement (%): 90 Station: 0 Exam by:: Maurissa Ambrose scnm  Labs: Lab Results  Component Value Date   WBC 13.6 (H) 07/26/2022   HGB 12.1 07/26/2022   HCT 36.5 07/26/2022   MCV 81.7 07/26/2022   PLT 308 07/26/2022    Assessment / Plan: Induction of labor due to gestational hypertension,  progressing well on pitocin  Labor: Progressing on Pitocin, will continue to increase then AROM Preeclampsia:   none Fetal Wellbeing:  Category I Pain Control:  Epidural I/D:   GBS neg Anticipated MOD:  NSVD  Karis Juba, Student-MidWife 07/26/2022, 8:54 AM

## 2022-07-26 NOTE — Anesthesia Procedure Notes (Signed)
Epidural Patient location during procedure: OB Start time: 07/26/2022 2:10 AM End time: 07/26/2022 2:19 AM  Staffing Anesthesiologist: Mal Amabile, MD Performed: anesthesiologist   Preanesthetic Checklist Completed: patient identified, IV checked, site marked, risks and benefits discussed, surgical consent, monitors and equipment checked, pre-op evaluation and timeout performed  Epidural Patient position: sitting Prep: DuraPrep and site prepped and draped Patient monitoring: continuous pulse ox and blood pressure Approach: midline Location: L4-L5 Injection technique: LOR air  Needle:  Needle type: Tuohy  Needle gauge: 17 G Needle length: 9 cm and 9 Needle insertion depth: 5 cm Catheter type: closed end flexible Catheter size: 19 Gauge Catheter at skin depth: 10 cm Test dose: negative and Other  Assessment Events: blood not aspirated, no cerebrospinal fluid, injection not painful, no injection resistance, no paresthesia and negative IV test  Additional Notes Patient identified. Risks and benefits discussed including failed block, incomplete  Pain control, post dural puncture headache, nerve damage, paralysis, blood pressure Changes, nausea, vomiting, reactions to medications-both toxic and allergic and post Partum back pain. All questions were answered. Patient expressed understanding and wished to proceed. Sterile technique was used throughout procedure. Epidural site was Dressed with sterile barrier dressing. No paresthesias, signs of intravascular injection Or signs of intrathecal spread were encountered.  Patient was more comfortable after the epidural was dosed. Please see RN's note for documentation of vital signs and FHR which are stable. Reason for block:procedure for pain

## 2022-07-26 NOTE — Anesthesia Preprocedure Evaluation (Addendum)
Anesthesia Evaluation  Patient identified by MRN, date of birth, ID band Patient awake    Reviewed: Allergy & Precautions, Patient's Chart, lab work & pertinent test results  Airway Mallampati: II       Dental no notable dental hx. (+) Teeth Intact   Pulmonary former smoker   Pulmonary exam normal        Cardiovascular negative cardio ROS Normal cardiovascular exam Rhythm:Regular     Neuro/Psych  PSYCHIATRIC DISORDERS  Depression    negative neurological ROS     GI/Hepatic Neg liver ROS,GERD  ,,(+)     (-) substance abuse    Endo/Other  diabetes, Well Controlled, Gestational  Obesity  Renal/GU negative Renal ROS  negative genitourinary   Musculoskeletal negative musculoskeletal ROS (+)    Abdominal  (+) + obese  Peds  Hematology negative hematology ROS (+)   Anesthesia Other Findings   Reproductive/Obstetrics (+) Pregnancy                             Anesthesia Physical Anesthesia Plan  ASA: 2  Anesthesia Plan: Epidural   Post-op Pain Management: Minimal or no pain anticipated   Induction:   PONV Risk Score and Plan:   Airway Management Planned: Natural Airway  Additional Equipment:   Intra-op Plan:   Post-operative Plan:   Informed Consent: I have reviewed the patients History and Physical, chart, labs and discussed the procedure including the risks, benefits and alternatives for the proposed anesthesia with the patient or authorized representative who has indicated his/her understanding and acceptance.       Plan Discussed with: Anesthesiologist  Anesthesia Plan Comments:        Anesthesia Quick Evaluation

## 2022-07-26 NOTE — Discharge Summary (Signed)
Postpartum Discharge Summary  Date of Service updated***     Patient Name: Deborah Brock DOB: 06/27/1985 MRN: [id number]  Date of admission: 07/26/2022 Delivery date:07/26/2022  Delivering provider: Calvert Cantor  Date of discharge: 07/26/2022  Admitting diagnosis: GDM (gestational diabetes mellitus) [O24.419] Intrauterine pregnancy: [redacted]w[redacted]d     Secondary diagnosis:  Principal Problem:   GDM (gestational diabetes mellitus)  Additional problems: None    Discharge diagnosis: Term Pregnancy Delivered and GDM A1                                              Post partum procedures: N/A Augmentation: Pitocin Complications: None  Hospital course: Induction of Labor With Vaginal Delivery   37 y.o. yo [id number] at [redacted]w[redacted]d was admitted to the hospital 07/26/2022 for induction of labor.  Indication for induction: A1 DM.  Patient had an uncomplicated labor course Membrane Rupture Time/Date: 1:00 AM ,07/26/2022   Delivery Method:Vaginal, Spontaneous  Episiotomy: None  Lacerations:    Details of delivery can be found in separate delivery note.  Patient had a postpartum course complicated by***. Patient is discharged home 07/26/22.  Newborn Data: Birth date:07/26/2022  Birth time:10:18 AM  Gender:Female  Living status:  Apgars: ,  Weight:   Magnesium Sulfate received: No BMZ received: No Rhophylac:N/A MMR:{MMR:[id number]} T-DaP:Given prenatally Flu: N/A Transfusion:No  Physical exam  Vitals:   07/26/22 0830 07/26/22 0900 07/26/22 0930 07/26/22 1010  BP: 99/66 115/68 116/77   Pulse: 72 75 (!) 103   Resp: 17     Temp:    98.5 F (36.9 C)  TempSrc:    Oral  Weight:      Height:       General: {Exam; general:21111117} Lochia: {Desc; appropriate/inappropriate:30686::"appropriate"} Uterine Fundus: {Desc; firm/soft:30687} Incision: {Exam; incision:21111123} DVT Evaluation: {Exam; dvt:2111122} Labs: Lab Results  Component Value Date   WBC 13.6 (H) 07/26/2022   HGB 12.1  07/26/2022   HCT 36.5 07/26/2022   MCV 81.7 07/26/2022   PLT 308 07/26/2022      Latest Ref Rng & Units 07/26/2022   12:40 AM  CMP  Glucose 70 - 99 mg/dL 88   BUN 6 - 20 mg/dL 9   Creatinine 9.56 - 3.87 mg/dL 5.64   Sodium 332 - 951 mmol/L 133   Potassium 3.5 - 5.1 mmol/L 3.6   Chloride 98 - 111 mmol/L 105   CO2 22 - 32 mmol/L 17   Calcium 8.9 - 10.3 mg/dL 8.5   Total Protein 6.5 - 8.1 g/dL 6.0   Total Bilirubin 0.3 - 1.2 mg/dL 0.4   Alkaline Phos 38 - 126 U/L 276   AST 15 - 41 U/L 20   ALT 0 - 44 U/L 11    Edinburgh Score:     No data to display           After visit meds:  Allergies as of 07/26/2022   No Known Allergies   Med Rec must be completed prior to using this [facility]***        Discharge home in stable condition Infant Feeding: Breast Infant Disposition:home with mother Discharge instruction: per After Visit Summary and Postpartum booklet. Activity: Advance as tolerated. Pelvic rest for 6 weeks.  Diet: routine diet Future Appointments: Future Appointments  Date Time Provider Department Center  09/09/2022 11:15 AM [place] Bing, MD [facility]   Follow  up Visit: Message sent by Thalia Bloodgood, CNM 07/26/2022  Please schedule this patient for a In person postpartum visit in 6 weeks with the following provider: Any provider. Additional Postpartum F/U:2 hour GTT  High risk pregnancy complicated by: GDM Delivery mode:  Vaginal, Spontaneous  Anticipated Birth Control:  Nexplanon   07/26/2022 Calvert Cantor, CNM

## 2022-07-26 NOTE — Anesthesia Postprocedure Evaluation (Signed)
Anesthesia Post Note  Patient: Deborah Brock  Procedure(s) Performed: AN AD HOC LABOR EPIDURAL     Patient location during evaluation: Mother Baby Anesthesia Type: Epidural Level of consciousness: awake and alert Pain management: pain level controlled Vital Signs Assessment: post-procedure vital signs reviewed and stable Respiratory status: spontaneous breathing, nonlabored ventilation and respiratory function stable Cardiovascular status: stable Postop Assessment: no headache, no backache and epidural receding Anesthetic complications: no  No notable events documented.  Last Vitals:  Vitals:   07/26/22 1223 07/26/22 1300  BP: 121/75 108/78  Pulse: 79 80  Resp: 17 18  Temp: 37 C 36.7 C  SpO2: 99%     Last Pain:  Vitals:   07/26/22 1630  TempSrc:   PainSc: 0-No pain   Pain Goal:                   Perry Molla N

## 2022-07-26 NOTE — Progress Notes (Signed)
Patient Vitals for the past 4 hrs:  BP Temp Temp src Pulse Resp  07/26/22 0600 134/70 -- -- 77 --  07/26/22 0530 114/76 -- -- 70 --  07/26/22 0500 118/73 97.8 F (36.6 C) Oral 71 17  07/26/22 0437 111/61 -- -- 67 --  07/26/22 0400 (!) 142/72 -- -- 75 --  07/26/22 0330 104/61 -- -- 71 --  07/26/22 0300 125/84 98 F (36.7 C) Oral 91 17  07/26/22 0245 131/85 -- -- 82 --  07/26/22 0240 (!) 143/87 -- -- 85 --  07/26/22 0235 136/88 -- -- 81 --  07/26/22 0230 136/86 -- -- 86 --   Comfortable w/epidural.  FHR Cat 1 . Ctx q 2-3 minutues.  Cx 6/90//0.  Continue present mgt.

## 2022-07-26 NOTE — Progress Notes (Signed)
ABDOMINAL BINDER GIVEN TO PT PER HER REQUEST AND ORDER FROM MD, INSTRUCTED ON USE, PT VERBALIZED UNDERSTANDING. Yetzali Weld RN

## 2022-07-26 NOTE — H&P (Addendum)
Deborah Brock is a 37 y.o. female [phone] with IUP at [redacted]w[redacted]d presenting for IOL for A1DM.  She reports positive fetal movement. She denies leakage of fluid or vaginal bleeding. Denies prodromal HSV sx  Prenatal History/Complications: PNC at [facility] Pregnancy complications:  - Past Medical History: Depression GDM  Past Surgical History: Past Surgical History:  Procedure Laterality Date   brazillian butt lift  11/2017   BREAST SURGERY     Augmentation 10/2013   DIAGNOSTIC LAPAROSCOPY WITH REMOVAL OF ECTOPIC PREGNANCY Right 01/07/2020   Procedure: DIAGNOSTIC LAPAROSCOPY WITH REMOVAL OF ECTOPIC PREGNANCY;  Surgeon: Edwinna Areola, DO;  Location: [facility] OR;  Service: Gynecology;  Laterality: Right;   LIPOSUCTION  11/2017   THERAPEUTIC ABORTION     May 2016   TONSILLECTOMY      Obstetrical History: OB History     Gravida  10   Para  3   Term  3   Preterm  0   AB  6   Living  3      SAB  3   IAB  2   Ectopic  1   Multiple  0   Live Births  3            Social History: Social History   Socioeconomic History   Marital status: Single    Spouse name: Not on file   Number of children: Not on file   Years of education: Not on file   Highest education level: Not on file  Occupational History   Not on file  Tobacco Use   Smoking status: Former    Types: Cigarettes    Quit date: 07/2021    Years since quitting: 1.0   Smokeless tobacco: Never  Vaping Use   Vaping Use: Never used  Substance and Sexual Activity   Alcohol use: No   Drug use: Not Currently    Types: Marijuana    Comment: last used months ago   Sexual activity: Yes    Birth control/protection: None  Other Topics Concern   Not on file  Social History Narrative   Not on file   Social Determinants of Health   Financial Resource Strain: Not on file  Food Insecurity: No Food Insecurity (07/26/2022)   Hunger Vital Sign    Worried About Running Out of Food in the Last Year: Never true     Ran Out of Food in the Last Year: Never true  Transportation Needs: No Transportation Needs (07/26/2022)   PRAPARE - Administrator, Civil Service (Medical): No    Lack of Transportation (Non-Medical): No  Physical Activity: Not on file  Stress: Not on file  Social Connections: Not on file    Family History: Family History  Problem Relation Age of Onset   Cancer Paternal Grandmother    Cancer Maternal Grandmother    Hypertension Maternal Grandmother    Diabetes Maternal Grandmother    Hypertension Mother    Diabetes Mother    Bipolar disorder Mother    Post-traumatic stress disorder Mother     Allergies: No Known Allergies  Medications Prior to Admission  Medication Sig Dispense Refill Last Dose   Accu-Chek Softclix Lancets lancets Check 4x per day (first thing in the morning, 2hrs after starting to eat each meal) (Patient not taking: Reported on 07/19/2022) 100 each 12    Blood Pressure Monitoring (BLOOD PRESSURE KIT) DEVI 1 Device by Does not apply route once a week. (Patient not taking: Reported  on 07/19/2022) 1 each 0    DICLEGIS 10-10 MG TBEC Take 2 tablets by mouth at bedtime. If symptoms persist, add one tablet in the morning and one in the afternoon 100 tablet 5    glucose blood (ACCU-CHEK GUIDE) test strip Check 4x per day (first thing in the morning, 2hrs after starting to eat each meal) (Patient not taking: Reported on 07/19/2022) 100 each 12    Prenatal 28-0.8 MG TABS Take 1 tablet by mouth daily. 30 tablet 12    terconazole (TERAZOL 7) 0.4 % vaginal cream Place 1 applicator vaginally at bedtime. (Patient not taking: Reported on 06/09/2022) 45 g 0    valACYclovir (VALTREX) 500 MG tablet Take 1 tablet (500 mg total) by mouth 2 (two) times daily. 60 tablet 3     Review of Systems   Constitutional: Negative for fever and chills Eyes: Negative for visual disturbances Respiratory: Negative for shortness of breath, dyspnea Cardiovascular: Negative for chest pain  or palpitations  Gastrointestinal: Negative for vomiting, diarrhea and constipation. Negative for abdominal pain  Genitourinary: Negative for dysuria and urgency Musculoskeletal: Negative for back pain, joint pain, myalgias  Neurological: Negative for dizziness and headaches  Blood pressure 128/78, pulse 93, temperature 97.9 F (36.6 C), temperature source Oral, resp. rate 17, height 5\' 2"  (1.575 m), weight 82.6 kg, last menstrual period 10/26/2021, unknown if currently breastfeeding. General appearance: alert, cooperative, and no distress Lungs: normal respiratory effort Heart: regular rate and rhythm Abdomen: soft, non-tender; bowel sounds normal Extremities: Homans sign is negative, no sign of DVT DTR's 2+ Presentation: cephalic Fetal monitoring  Baseline: 150 bpm, Variability: Good {> 6 bpm), Accelerations: Reactive, and Decelerations: Absent Uterine activity  None Dilation: 3.5 Effacement (%): 90, 100 Station: -1 Exam by:: Drenda Freeze C., cnm SSE:  no lesions visible   Prenatal labs: ABO, Rh: O+ Antibody: PENDING (06/11 0040) Rubella: <0.90 (12/12 1611) RPR: Non Reactive (04/02 0815)  HBsAg: Negative (12/12 1611)  HIV: Non Reactive (04/02 0815)  GBS: Negative/-- (05/20 1652)   Nursing Staff Provider  Office Location [facility] for [facility] Dating  08/02/2022, by Last Menstrual Period  [facility] Model [ ]  Traditional [x]  Centering G 10 [ ]  Mom-Baby Dyad Anatomy US  normal  Language  English    Flu Vaccine  N/a Genetic/Carrier Screen  NIPS: low risk female   AFP:  Negative  Horizon: 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  TDaP Vaccine   05/17/2022 Hgb A1C or  GTT Early  Third trimester - abnormal  COVID Vaccine  No   LAB RESULTS   Rhogam  O/Positive/-- (12/12 1611)  Blood Type O/Positive/-- (12/12 1611)   Baby Feeding Plan  Br/Bo Antibody Negative (12/12 1611)  Contraception  Nexplanon Rubella <0.90 (12/12 1611)  Circumcision  Yes if female RPR Non Reactive (04/02 0815)   Pediatrician    Unsure HBsAg Negative (12/12 1611)   Support Person  Thornell Sartorius HCVAb Non Reactive (12/12 1611)   Prenatal Classes  HIV Non Reactive (04/02 0815)     BTL Consent  GBS Negative/-- (05/20 1652) (For PCN allergy, check sensitivities)   VBAC Consent NA Pap Diagnosis  Date Value Ref Range Status  02/24/2022   Final   - Negative for Intraepithelial Lesions or Malignancy (NILM)         DME Rx [X]  BP cuff [ ]  Weight Scale Waterbirth  [ ]  Class [ ]  Consent [ ]  CNM visit  PHQ9 & GAD7 [X]  new OB [  ] 28 weeks  [  ]  36 weeks Induction  [ ]  Orders Entered [ ] Foley Y/N    Prenatal Transfer Tool  Maternal Diabetes: Yes:  Diabetes Type:  Diet controlled Genetic Screening: Normal Maternal Ultrasounds/Referrals: Normal Fetal Ultrasounds or other Referrals:  None Maternal Substance Abuse:  No Significant Maternal Medications:  None Significant Maternal Lab Results: Group B Strep negative  Results for orders placed or performed during the hospital encounter of 07/26/22 (from the past 24 hour(s))  CBC   Collection Time: 07/26/22 12:40 AM  Result Value Ref Range   WBC 13.6 (H) 4.0 - 10.5 K/uL   RBC 4.47 3.87 - 5.11 MIL/uL   Hemoglobin 12.1 12.0 - 15.0 g/dL   HCT 16.1 09.6 - 04.5 %   MCV 81.7 80.0 - 100.0 fL   MCH 27.1 26.0 - 34.0 pg   MCHC 33.2 30.0 - 36.0 g/dL   RDW 40.9 81.1 - 91.4 %   Platelets 308 150 - 400 K/uL   nRBC 0.0 0.0 - 0.2 %  Type and screen   Collection Time: 07/26/22 12:40 AM  Result Value Ref Range   ABO/RH(D) PENDING    Antibody Screen PENDING    Sample Expiration      07/29/2022,2359 Performed at [facility], [address] [place], [place] [address]     Assessment: Deborah Brock is a 37 y.o. [id number] with an IUP at [redacted]w[redacted]d presenting for A1DM  Plan: #Labor: AROM w/clear fluid>pitocin PRN #Pain:  Per request #FWB Cat 1   Jacklyn Shell 07/26/2022, 1:21 AM

## 2022-07-26 NOTE — Plan of Care (Signed)
  Problem: Education: Goal: Knowledge of Childbirth will improve Outcome: Progressing Goal: Ability to make informed decisions regarding treatment and plan of care will improve Outcome: Progressing Goal: Ability to state and carry out methods to decrease the pain will improve Outcome: Progressing   Problem: Coping: Goal: Ability to verbalize concerns and feelings about labor and delivery will improve Outcome: Progressing   

## 2022-07-27 DIAGNOSIS — Z30017 Encounter for initial prescription of implantable subdermal contraceptive: Secondary | ICD-10-CM

## 2022-07-27 MED ORDER — IBUPROFEN 600 MG PO TABS: 600.0000 mg | ORAL_TABLET | Freq: Four times a day (QID) | ORAL | 0 refills | Status: AC

## 2022-07-27 MED ORDER — ACETAMINOPHEN 325 MG PO TABS: 650.0000 mg | ORAL_TABLET | ORAL | 1 refills | Status: AC | PRN

## 2022-07-27 MED ORDER — ETONOGESTREL 68 MG ~~LOC~~ IMPL
68.0000 mg | DRUG_IMPLANT | Freq: Once | SUBCUTANEOUS | Status: AC
  Administered 2022-07-27: 68 mg via SUBCUTANEOUS
  Filled 2022-07-27: qty 1

## 2022-07-27 MED ORDER — LIDOCAINE HCL 1 % IJ SOLN
0.0000 mL | Freq: Once | INTRAMUSCULAR | Status: AC | PRN
  Administered 2022-07-27: 20 mL via INTRADERMAL
  Filled 2022-07-27: qty 20

## 2022-07-27 NOTE — Lactation Note (Signed)
This note was copied from a baby's chart. Lactation Consultation Note  Patient Name: Deborah Brock [id number] Date: 07/27/2022 Age:35 hours Reason for consult: Initial assessment;1st time breastfeeding;Term;Infant weight loss;Breast augmentation;Breastfeeding assistance (0.14% WL) The infant was at 31 hours old.  Per the birth parent the infant has been to the breast twice since birth.  She stated that she has not seen a lot of milk.  LC reviewed milk production and the importance of stimulation.  LC educated the birth parent on supply and demand.  The birth parent said that she asked for assistance with her pump yesterday but she was unable to get any.  LC explained that lactation can only assist with Medela brand pumps.  The birth parent was offered to be set up with the DEBP by an RN yesterday.  She declined and said that she preferred to use the manual.  LC offered to show the birth parent how to use the manual pump.  She stated that she would call later for assistance with hand expression and the manual pump.  LC spoke with the birth parent about outpatient services and asked her to call for lactation when she is ready. LC encouraged the birth parent to try to put the infant to the breast before supplementing.   Infant Feeding Plan:  Breastfeed 8+ times in 24 hours according to feeding cues.  Put the infant to the breast prior to supplementing.  Supplement according to the supplementation guidelines.  Call RN/LC for assistance with breastfeeding.    Maternal Data Has patient been taught Hand Expression?: Yes Does the patient have breastfeeding experience prior to this delivery?: No  Feeding Mother's Current Feeding Choice: Breast Milk and Formula  Interventions Interventions: Education;LC Services brochure  Discharge Pump: Personal;Hands Free;Manual  Consult Status Consult Status: Follow-up Date: 07/28/22 Follow-up type: In-patient   Deborah Brock 07/27/2022,  8:08 AM

## 2022-07-27 NOTE — Procedures (Signed)
PRE-OP DIAGNOSIS: desired long-term, reversible contraception   POST-OP DIAGNOSIS: Same   PROCEDURE: Nexplanon  placement  Performing Physician: Shonna Chock, MD   PROCEDURE:   Written informed consent obtained after discussion of risks including bleeding, infection, and damage to surrounding tissue.   Site (check): left arm        Sterile Preparation:    [x]       Betadine        [_]     Chloraprep             After a time-out, insertion site was selected 6 - 10 cm from medial epicondyle and marked. Procedure area was prepped and draped in a sterile fashion. 3 mL of 1% lidocaine w/o epinephrine was used for subcutaneous anesthesia. Anesthesia confirmed.  Nexplanon  trocar was inserted subcutaneously and then Nexplanon  capsule delivered subcutaneously. Trocar was removed from the insertion site. Nexplanon  capsule was palpated by provider and patient to assure satisfactory placement.  Estimated blood loss: minimal Dressings applied: steri-strep, band-aid, coband Followup: The patient tolerated the procedure well without complications.  Standard post-procedure care wass explained and return precautions were given.

## 2022-08-15 ENCOUNTER — Telehealth (HOSPITAL_COMMUNITY): Payer: Self-pay | Admitting: *Deleted

## 2022-08-15 NOTE — Telephone Encounter (Signed)
08/15/2022  Name: Deborah Brock MRN: [id number] DOB: [birth year]  Reason for Call:  Transition of Care Hospital Discharge Call  Contact Status: Patient Contact Status: Message  Language assistant needed: Interpreter Mode: Interpreter Not Needed        Follow-Up Questions:    Inocente Salles Postnatal Depression Scale:  In the Past 7 Days:    PHQ2-9 Depression Scale:     Discharge Follow-up:    Post-discharge interventions: NA  Salena Saner, RN 08/15/22 13:58

## 2022-09-09 ENCOUNTER — Ambulatory Visit: Payer: Medicaid Other | Admitting: Obstetrics and Gynecology

## 2022-09-09 ENCOUNTER — Encounter: Payer: Self-pay | Admitting: Obstetrics and Gynecology

## 2022-09-10 NOTE — Progress Notes (Signed)
Patient did not keep her postpartum appointment for 09/09/2022.  Cornelia Copa MD Attending Center for [employer] [facility])

## 2024-03-04 IMAGING — US US OB < 14 WEEKS - US OB TV
1 series · 15 of 28 positions shown · non-contrast
Comparison: August 10, 2020.

CLINICAL DATA: Right lower quadrant abdominal pain.

EXAM:
OBSTETRIC <14 WK US AND TRANSVAGINAL OB US
TECHNIQUE: Both transabdominal and transvaginal ultrasound examinations were
performed for complete evaluation of the gestation as well as the
maternal uterus, adnexal regions, and pelvic cul-de-sac.
Transvaginal technique was performed to assess early pregnancy.

[Series 1: us ob < 14 weeks - us ob tv · 15 of 79 slices shown]
[im 1/79]
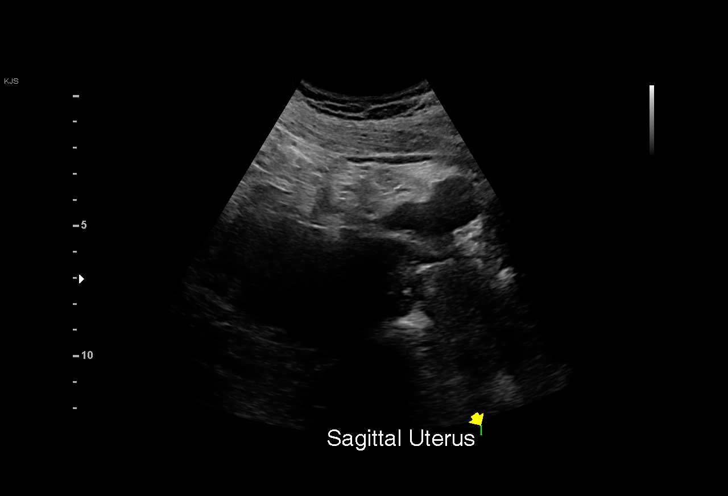
[im 6/79]
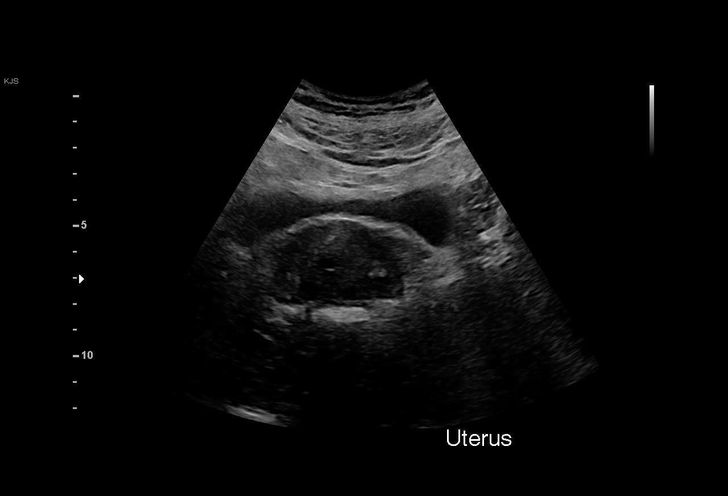
[im 12/79]
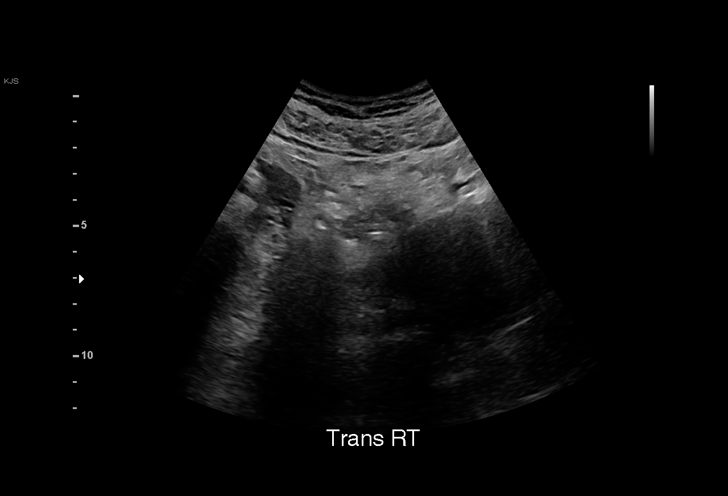
[im 18/79]
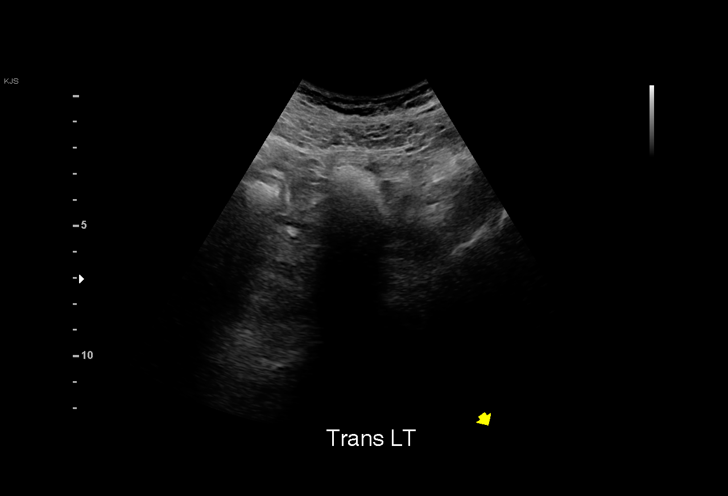
[im 24/79]
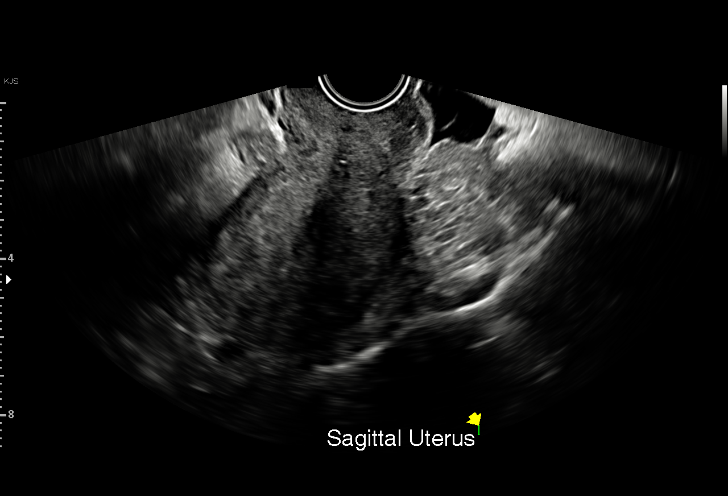
[im 29/79]
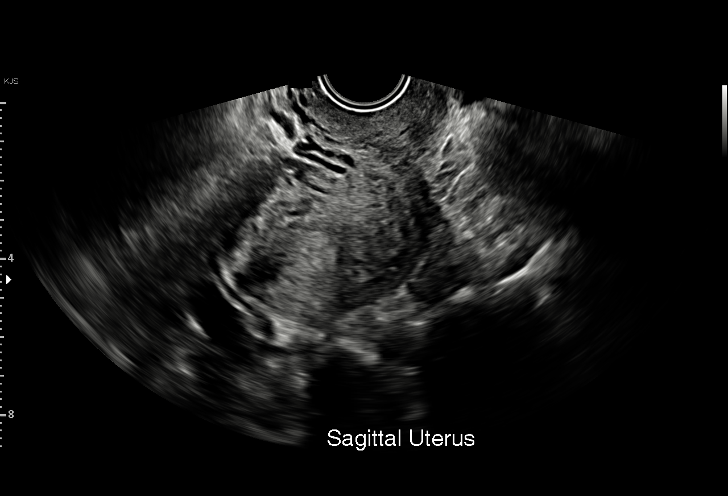
[im 35/79]
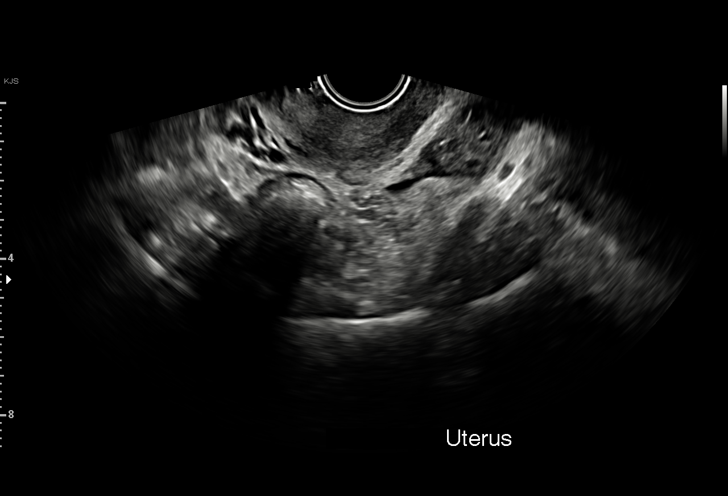
[im 41/79]
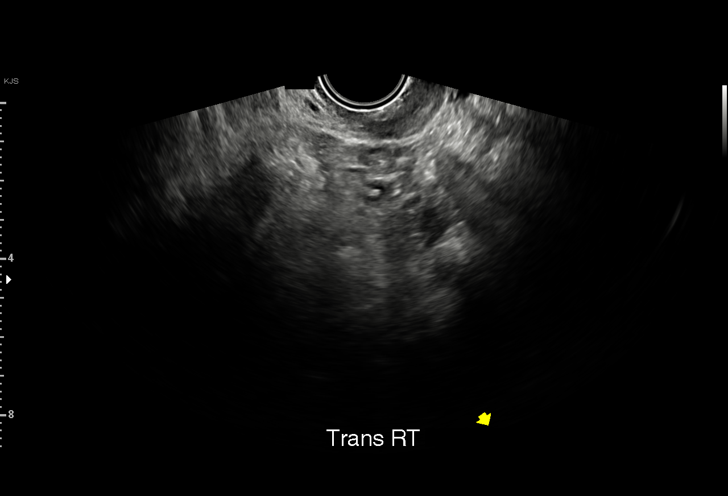
[im 44/79]
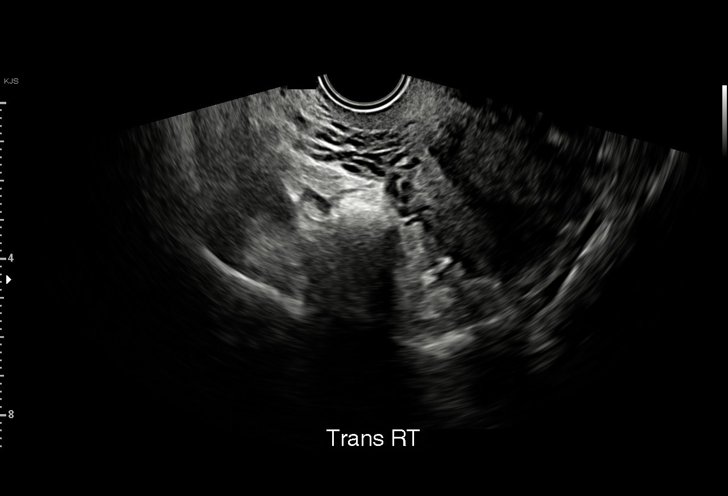
[im 50/79]
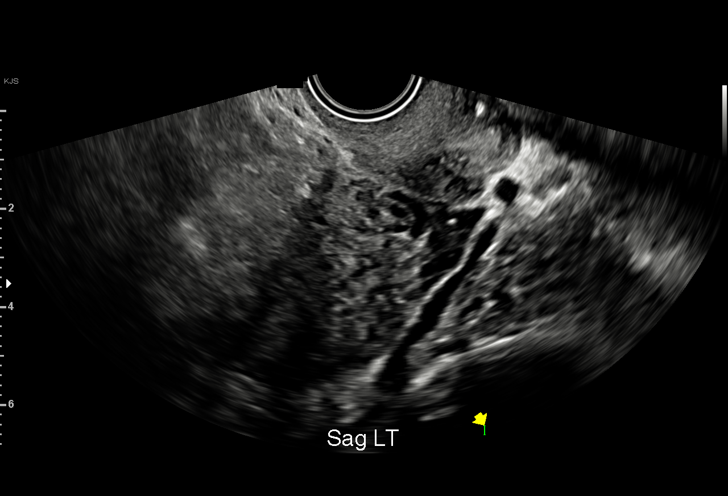
[im 55/79]
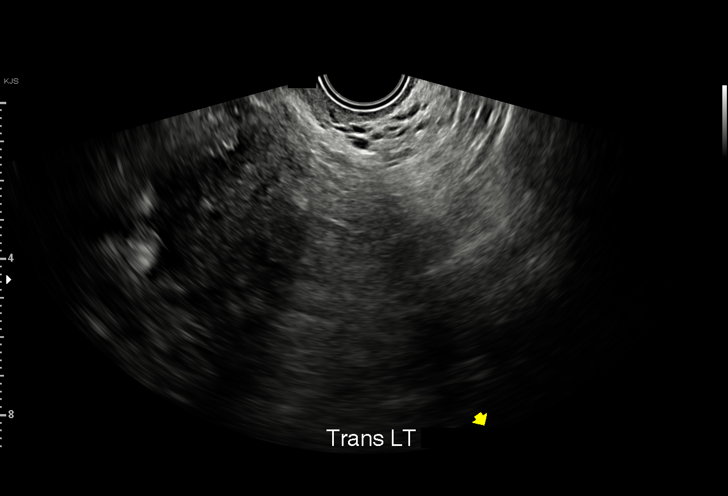
[im 61/79]
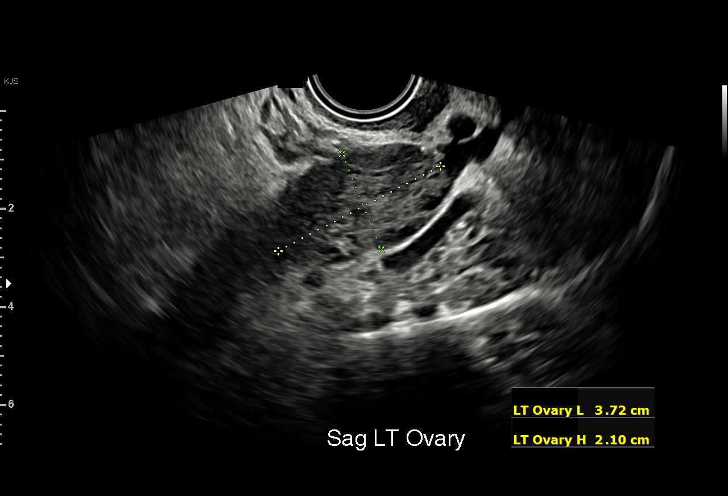
[im 67/79]
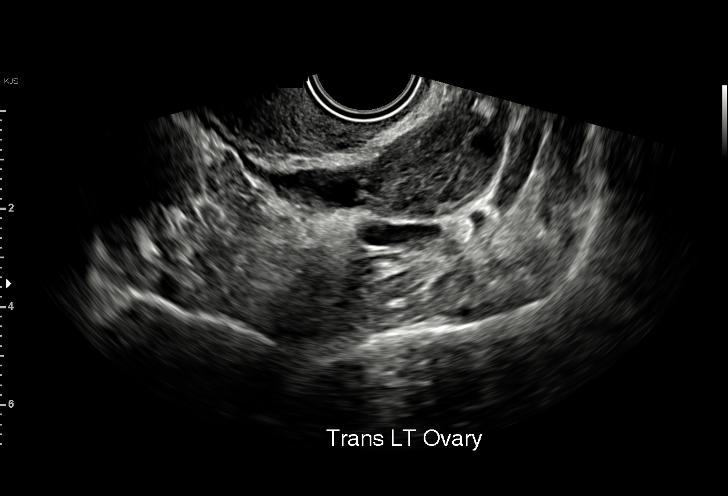
[im 73/79]
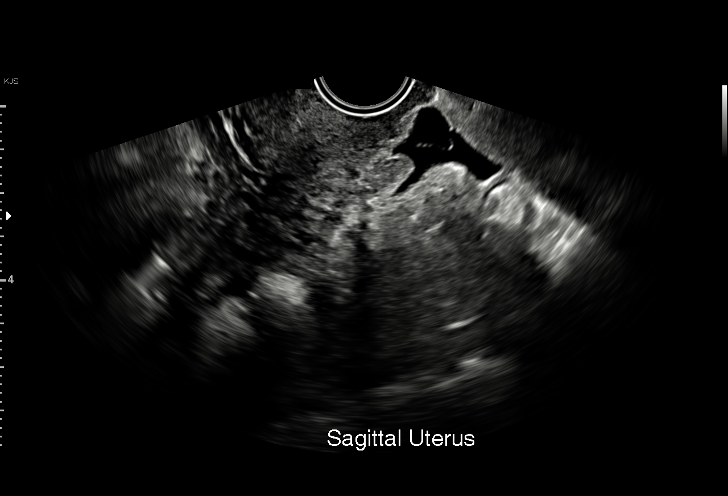
[im 79/79]
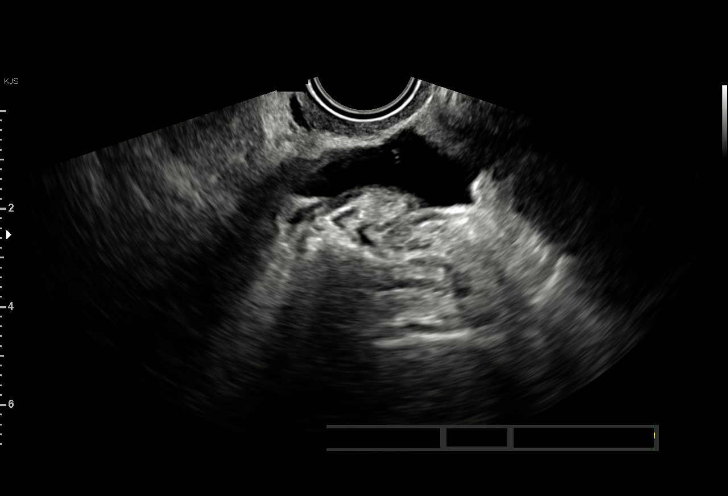

[15 of 28 positions shown; findings below may reference images not displayed]

FINDINGS: Intrauterine gestational sac: None

Yolk sac:  Not Visualized.

Embryo:  Not Visualized.

Cardiac Activity: Not Visualized.

Subchorionic hemorrhage:  None visualized.

Maternal uterus/adnexae: Right ovary is unremarkable. Small amount
of free fluid is noted which most likely is physiologic. Possible
corpus luteum cyst seen in left ovary.
IMPRESSION: No intrauterine gestational sac, yolk sac, fetal pole, or cardiac
activity visualized. Differential considerations include
intrauterine gestation too early to be sonographically visualized,
spontaneous abortion, or ectopic pregnancy. Consider follow-up
ultrasound in 14 days and serial quantitative beta HCG follow-up.

## 2024-04-21 IMAGING — US US OB COMP LESS 14 WK
1 series · 15 of 28 positions shown · non-contrast
Comparison: Pelvic ultrasound 05/17/2021.

CLINICAL DATA: 35-year-old pregnant female.  Assess for viability.



[Series 1: us ob comp less 14 wk · 38 acquisitions, 15 frames shown]
[im 1/38]
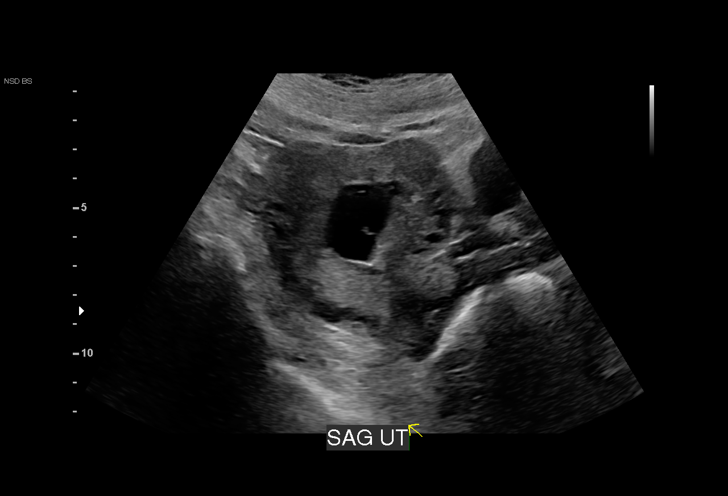
[im 3/38]
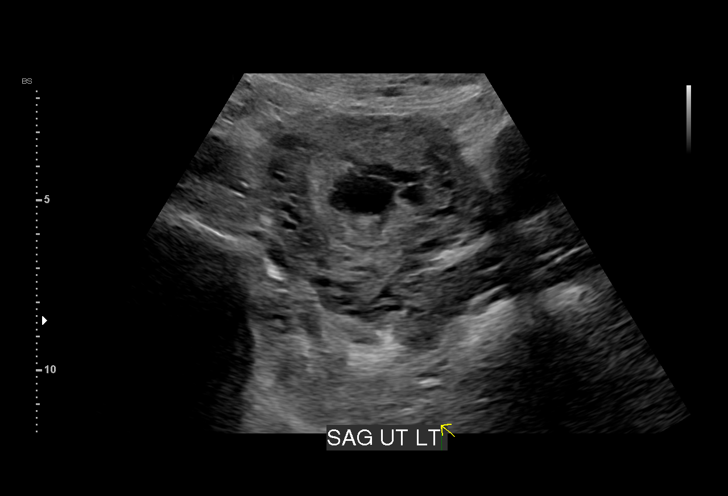
[im 6/38]
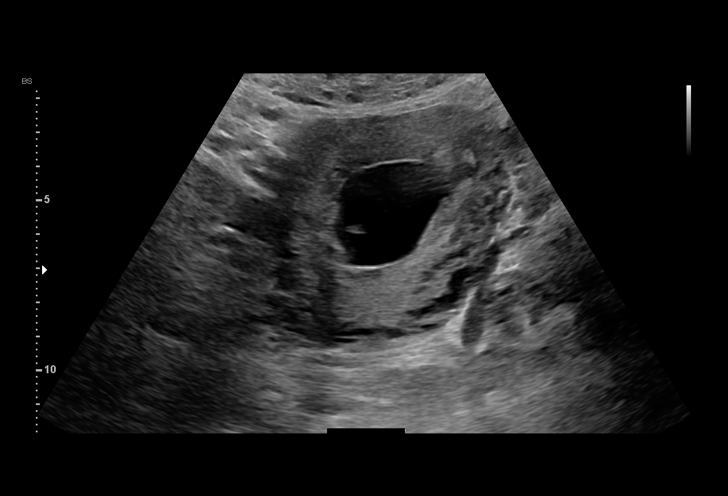
[im 9/38]
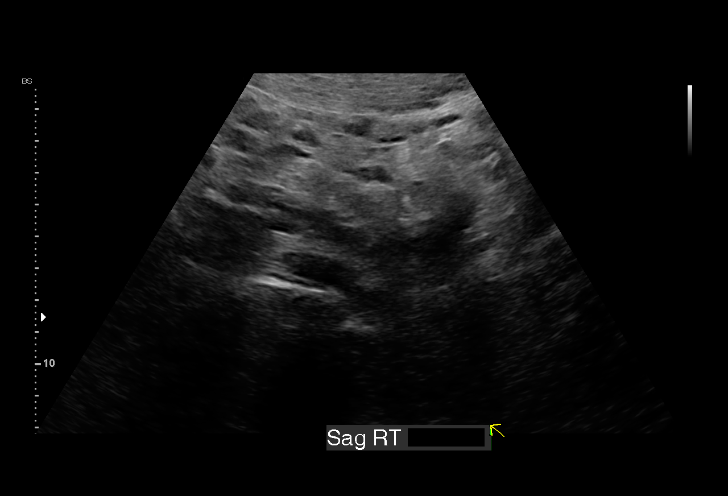
[im 11/38]
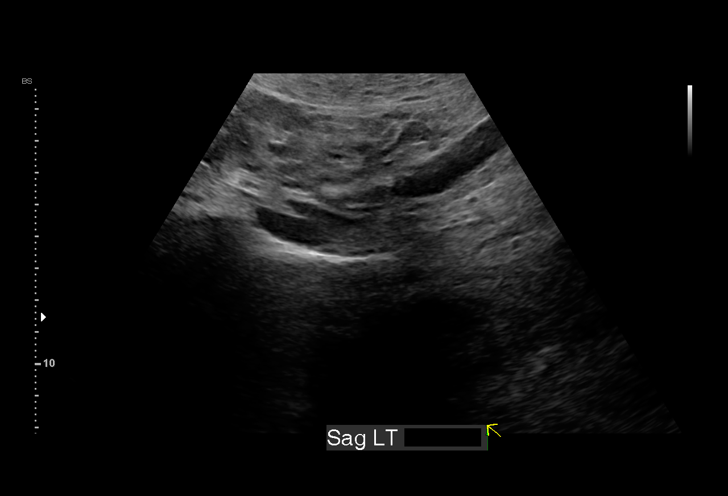
[im 14/38]
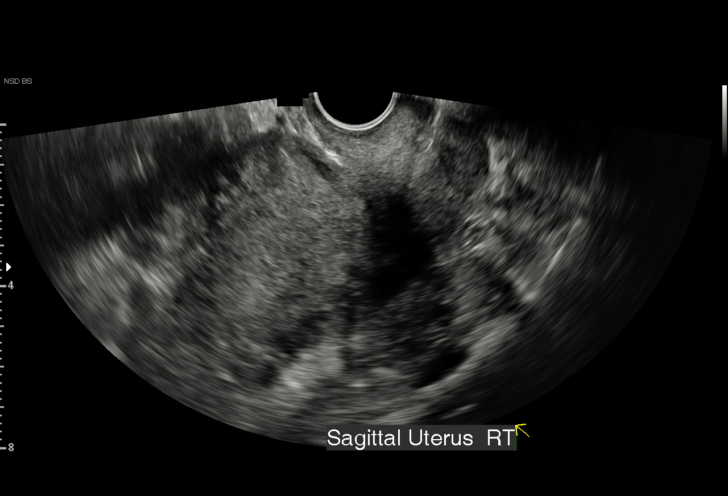
[im 17/38]
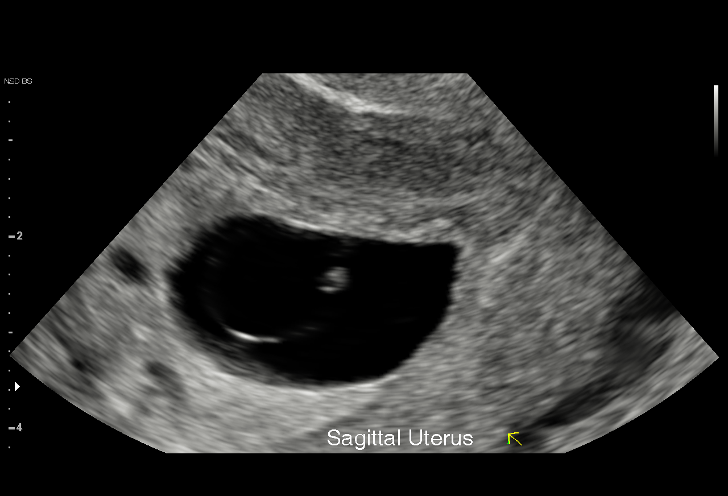
[im 20/38]
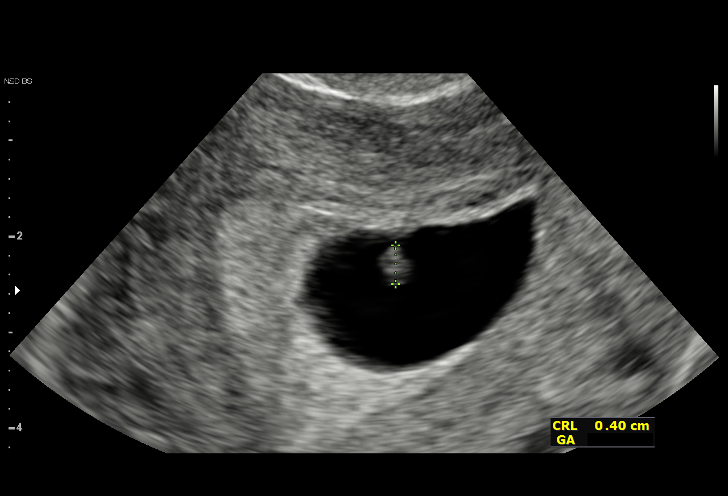
[im 21/38]
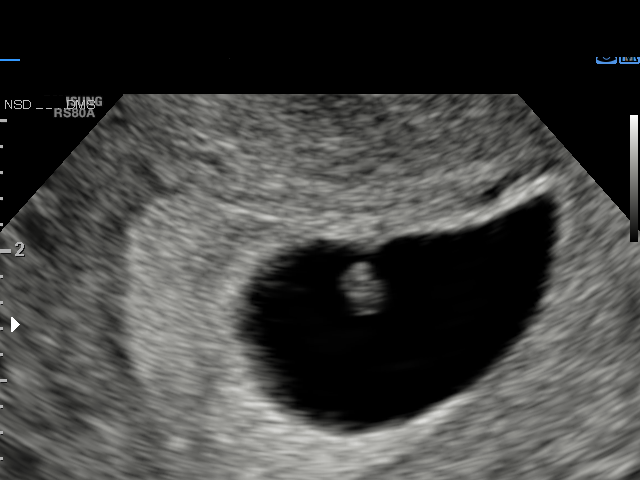
[im 24/38]
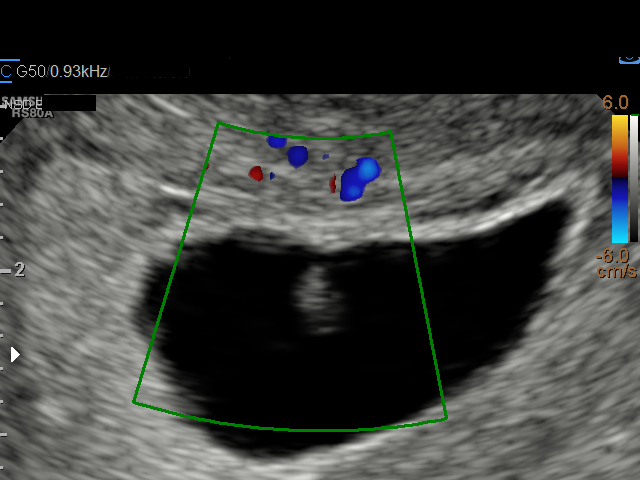
[im 27/38]
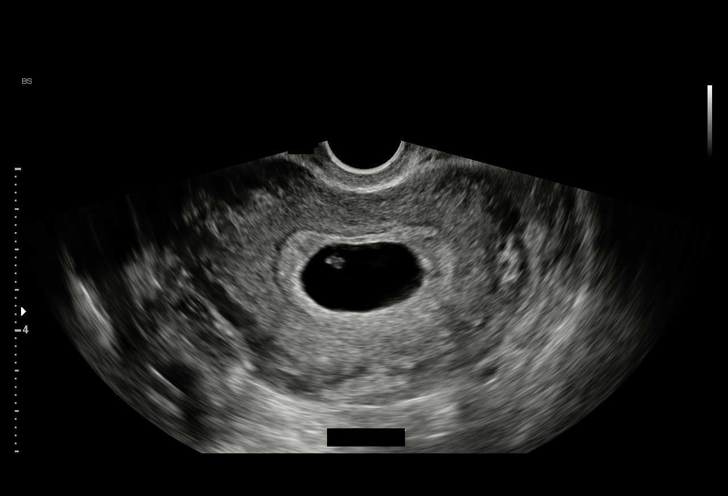
[im 29/38]
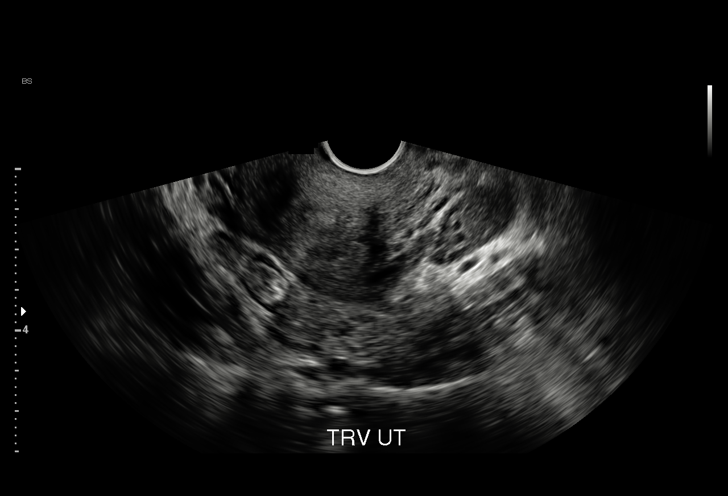
[im 32/38]
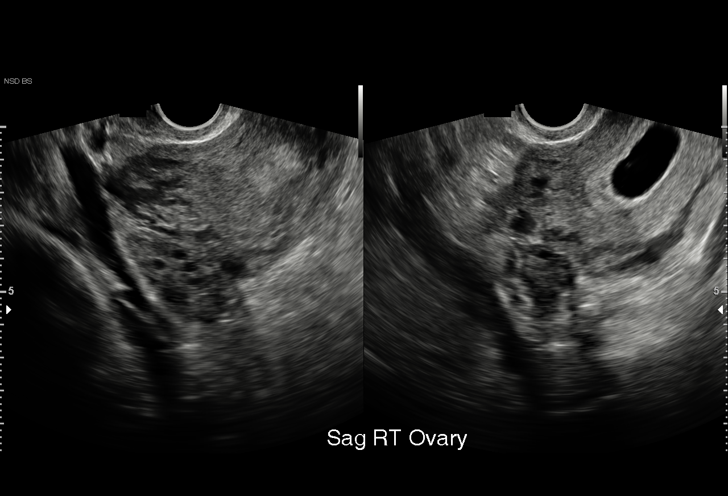
[im 35/38]
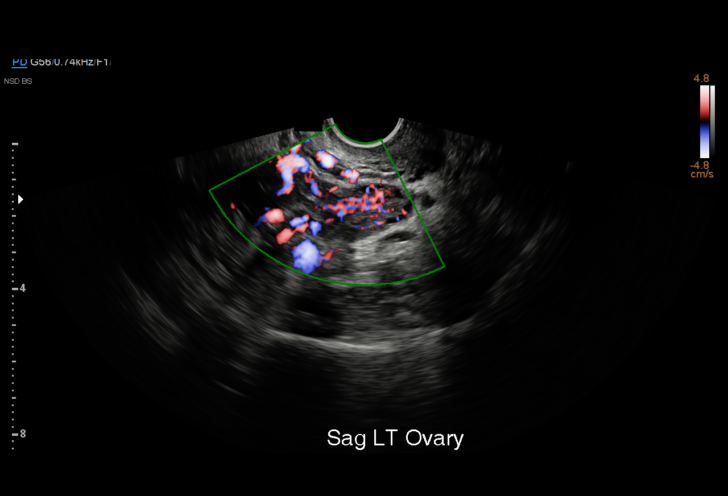
[im 38/38]
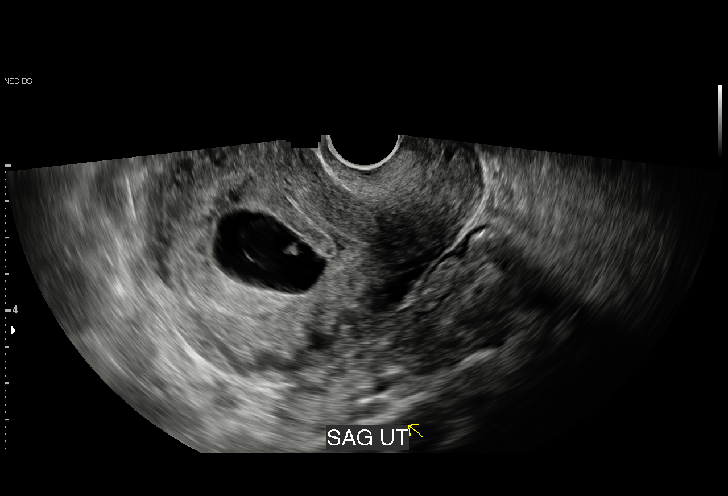

[15 of 28 positions shown; findings below may reference images not displayed]

FINDINGS: Intrauterine gestational sac: Single

Yolk sac:  None

Embryo:  Present

Cardiac Activity: None

Heart Rate: N/A

CRL:   4.1 mm   6 w 1 d                  US EDC: 02/12/2022

Subchorionic hemorrhage:  None visualized.

Maternal uterus/adnexae: Uterus and ovaries are otherwise grossly
normal in appearance.

Pulsed Doppler evaluation of both ovaries demonstrates normal
appearing low-resistance arterial and venous waveforms.
IMPRESSION: 1. Single intrauterine gestational sac with embryo, but no yolk sac
or cardiac activity. Mean sac diameter is slightly smaller than
prior examination. Findings meet definitive criteria for failed
pregnancy. This follows SRU consensus guidelines: Diagnostic
Criteria for Nonviable Pregnancy Early in the First Trimester. N
Engl J Med 7749;[DATE].
